# Patient Record
Sex: Male | Born: 1965 | Hispanic: Yes | Marital: Married | Smoking: Never smoker
Health system: Southern US, Community
[De-identification: ages and names within clinical notes are randomized; demographics above are authoritative.]

---

## 2019-10-31 ENCOUNTER — Encounter (HOSPITAL_COMMUNITY): Payer: Self-pay

## 2019-10-31 ENCOUNTER — Emergency Department (HOSPITAL_COMMUNITY): Payer: Self-pay

## 2019-10-31 ENCOUNTER — Emergency Department (HOSPITAL_COMMUNITY)
Admission: EM | Admit: 2019-10-31 | Discharge: 2019-10-31 | Disposition: A | Discharge status: Home / Self Care | Payer: Self-pay | Attending: Emergency Medicine | Admitting: Emergency Medicine

## 2019-10-31 ENCOUNTER — Other Ambulatory Visit: Payer: Self-pay

## 2019-10-31 DIAGNOSIS — U071 COVID-19: Secondary | ICD-10-CM | POA: Insufficient documentation

## 2019-10-31 DIAGNOSIS — R05 Cough: Secondary | ICD-10-CM | POA: Insufficient documentation

## 2019-10-31 DIAGNOSIS — J1289 Other viral pneumonia: Secondary | ICD-10-CM | POA: Insufficient documentation

## 2019-10-31 DIAGNOSIS — J1282 Pneumonia due to coronavirus disease 2019: Secondary | ICD-10-CM

## 2019-10-31 MED ORDER — ALBUTEROL SULFATE HFA 108 (90 BASE) MCG/ACT IN AERS
2.0000 | INHALATION_SPRAY | Freq: Once | RESPIRATORY_TRACT | Status: AC
  Administered 2019-10-31: 2 via RESPIRATORY_TRACT
  Filled 2019-10-31: qty 6.7

## 2019-10-31 MED ORDER — AZITHROMYCIN 250 MG PO TABS: ORAL_TABLET | ORAL | 0 refills | Status: DC

## 2019-10-31 MED ORDER — ACETAMINOPHEN 325 MG PO TABS: 650.0000 mg | ORAL_TABLET | Freq: Once | ORAL | Status: DC

## 2019-10-31 NOTE — Discharge Instructions (Addendum)
You likely have pneumonia from COVID.   You can take zpack as prescribed.   You are expected to be short of breath and have fevers. Take tylenol for fevers   Stay home for 10 days as per guidelines   Use albuterol every 4 hrs for shortness of breath   See your doctor  Return to ER if you have worse shortness of breath, fever, cough.      [name]  Location: [address] [place] [id number]   Infection Prevention Recommendations for Individuals Confirmed to have, or Being Evaluated for, 2019 Novel Coronavirus (COVID-19) Infection Who Receive Care at Home  Individuals who are confirmed to have, or are being evaluated for, COVID-19 should follow the prevention steps below until a healthcare provider or local or state health department says they can return to normal activities.  Stay home except to get medical care You should restrict activities outside your home, except for getting medical care. Do not go to work, school, or public areas, and do not use public transportation or taxis.  Call ahead before visiting your doctor Before your medical appointment, call the healthcare provider and tell them that you have, or are being evaluated for, COVID-19 infection. This will help the healthcare provider's office take steps to keep other people from getting infected. Ask your healthcare provider to call the local or state health department.  Monitor your symptoms Seek prompt medical attention if your illness is worsening (e.g., difficulty breathing). Before going to your medical appointment, call the healthcare provider and tell them that you have, or are being evaluated for, COVID-19 infection. Ask your healthcare provider to call the local or state health department.  Wear a facemask You should wear a facemask that covers your nose and mouth when you are in the same room with other people and when you visit a healthcare provider. People  who live with or visit you should also wear a facemask while they are in the same room with you.  Separate yourself from other people in your home As much as possible, you should stay in a different room from other people in your home. Also, you should use a separate bathroom, if available.  Avoid sharing household items You should not share dishes, drinking glasses, cups, eating utensils, towels, bedding, or other items with other people in your home. After using these items, you should wash them thoroughly with soap and water.  Cover your coughs and sneezes Cover your mouth and nose with a tissue when you cough or sneeze, or you can cough or sneeze into your sleeve. Throw used tissues in a lined trash can, and immediately wash your hands with soap and water for at least 20 seconds or use an alcohol-based hand rub.  Wash your [employer]hands Wash your hands often and thoroughly with soap and water for at least 20 seconds. You can use an alcohol-based hand sanitizer if soap and water are not available and if your hands are not visibly dirty. Avoid touching your eyes, nose, and mouth with unwashed hands.   Prevention Steps for Caregivers and Household Members of Individuals Confirmed to have, or Being Evaluated for, COVID-19 Infection Being Cared for in the Home  If you live with, or provide care at home for, a person confirmed to have, or being evaluated for, COVID-19 infection please follow these guidelines to prevent infection:  Follow healthcare provider's instructions Make sure that you understand and can help the patient follow  any healthcare provider instructions for all care.  Provide for the patient's basic needs You should help the patient with basic needs in the home and provide support for getting groceries, prescriptions, and other personal needs.  Monitor the patient's symptoms If they are getting sicker, call his or her medical provider and tell them that the patient has, or  is being evaluated for, COVID-19 infection. This will help the healthcare provider's office take steps to keep other people from getting infected. Ask the healthcare provider to call the local or state health department.  Limit the number of people who have contact with the patient If possible, have only one caregiver for the patient. Other household members should stay in another home or place of residence. If this is not possible, they should stay in another room, or be separated from the patient as much as possible. Use a separate bathroom, if available. Restrict visitors who do not have an essential need to be in the home.  Keep older adults, very young children, and other sick people away from the patient Keep older adults, very young children, and those who have compromised immune systems or chronic health conditions away from the patient. This includes people with chronic heart, lung, or kidney conditions, diabetes, and cancer.  Ensure good ventilation Make sure that shared spaces in the home have good air flow, such as from an air conditioner or an opened window, weather permitting.  Wash your hands often Wash your hands often and thoroughly with soap and water for at least 20 seconds. You can use an alcohol based hand sanitizer if soap and water are not available and if your hands are not visibly dirty. Avoid touching your eyes, nose, and mouth with unwashed hands. Use disposable paper towels to dry your hands. If not available, use dedicated cloth towels and replace them when they become wet.  Wear a facemask and gloves Wear a disposable facemask at all times in the room and gloves when you touch or have contact with the patient's blood, body fluids, and/or secretions or excretions, such as sweat, saliva, sputum, nasal mucus, vomit, urine, or feces.  Ensure the mask fits over your nose and mouth tightly, and do not touch it during use. Throw out disposable facemasks and gloves after  using them. Do not reuse. Wash your hands immediately after removing your facemask and gloves. If your personal clothing becomes contaminated, carefully remove clothing and launder. Wash your hands after handling contaminated clothing. Place all used disposable facemasks, gloves, and other waste in a lined container before disposing them with other household waste. Remove gloves and wash your hands immediately after handling these items.  Do not share dishes, glasses, or other household items with the patient Avoid sharing household items. You should not share dishes, drinking glasses, cups, eating utensils, towels, bedding, or other items with a patient who is confirmed to have, or being evaluated for, COVID-19 infection. After the person uses these items, you should wash them thoroughly with soap and water.  Wash laundry thoroughly Immediately remove and wash clothes or bedding that have blood, body fluids, and/or secretions or excretions, such as sweat, saliva, sputum, nasal mucus, vomit, urine, or feces, on them. Wear gloves when handling laundry from the patient. Read and follow directions on labels of laundry or clothing items and detergent. In general, wash and dry with the warmest temperatures recommended on the label.  Clean all areas the individual has used often Clean all touchable surfaces, such as counters,  tabletops, doorknobs, bathroom fixtures, toilets, phones, keyboards, tablets, and bedside tables, every day. Also, clean any surfaces that may have blood, body fluids, and/or secretions or excretions on them. Wear gloves when cleaning surfaces the patient has come in contact with. Use a diluted bleach solution (e.g., dilute bleach with 1 part bleach and 10 parts water) or a household disinfectant with a label that says EPA-registered for coronaviruses. To make a bleach solution at home, add 1 tablespoon of bleach to 1 quart (4 cups) of water. For a larger supply, add  cup of bleach  to 1 gallon (16 cups) of water. Read labels of cleaning products and follow recommendations provided on product labels. Labels contain instructions for safe and effective use of the cleaning product including precautions you should take when applying the product, such as wearing gloves or eye protection and making sure you have good ventilation during use of the product. Remove gloves and wash hands immediately after cleaning.  Monitor yourself for signs and symptoms of illness Caregivers and household members are considered close contacts, should monitor their health, and will be asked to limit movement outside of the home to the extent possible. Follow the monitoring steps for close contacts listed on the symptom monitoring form.   ? If you have additional questions, contact your local health department or call the epidemiologist on call at [phone] (available 24/7). ? This guidance is subject to change. For the most up-to-date guidance from [facility], please refer to their website: [email or web address]

## 2019-10-31 NOTE — ED Provider Notes (Signed)
[facility]-EMERGENCY DEPT Provider Note   CSN: [id number] Arrival date & time: 10/31/19  1939     History Chief Complaint  Patient presents with  . COVID +  . Shortness of Breath    Donald Trujillo is a 53 y.o. male here presenting with shortness of breath. Patient tested positive for Covid 3 days ago.  For the last 8 days he has been having some cough and thought he only had a viral syndrome.  For the last 2 days he has worsening cough and shortness of breath now.  Some chills but denies any fever at home and took Tylenol prior to arrival.  Patient is otherwise healthy has no medical problems .  He denies any lung problems or smoking history.   The history is provided by the patient.       History reviewed. No pertinent past medical history.  There are no problems to display for this patient.   No family history on file.  Social History   Tobacco Use  . Smoking status: Never Smoker  . Smokeless tobacco: Never Used  Substance Use Topics  . Alcohol use: Yes    Comment: occ  . Drug use: Never    Home Medications Prior to Admission medications   Not on File    Allergies    Patient has no allergy information on record.  Review of Systems   Review of Systems  Respiratory: Positive for cough and shortness of breath.   All other systems reviewed and are negative.   Physical Exam Updated Vital Signs BP 116/79 (BP Location: Left Arm)   Pulse 74   Temp 99.2 F (37.3 C) (Oral)   Resp 16   Ht 5\' 5"  (1.651 m)   Wt 78 kg   SpO2 97%   BMI 28.62 kg/m   Physical Exam Vitals and nursing note reviewed.  HENT:     Head: Normocephalic.  Eyes:     Pupils: Pupils are equal, round, and reactive to light.  Cardiovascular:     Rate and Rhythm: Normal rate and regular rhythm.  Pulmonary:     Comments: Tachypneic, no wheezing  Abdominal:     General: Bowel sounds are normal.     Palpations: Abdomen is soft.  Musculoskeletal:      General: Normal range of motion.     Cervical back: Normal range of motion.  Skin:    General: Skin is warm.     Capillary Refill: Capillary refill takes less than 2 seconds.  Neurological:     General: No focal deficit present.     Mental Status: He is alert and oriented to person, place, and time.  Psychiatric:        Mood and Affect: Mood normal.        Behavior: Behavior normal.     ED Results / Procedures / Treatments   Labs (all labs ordered are listed, but only abnormal results are displayed) Labs Reviewed - No data to display  EKG EKG Interpretation  Date/Time:  Friday October 31 2019 20:08:32 EST Ventricular Rate:  78 PR Interval:    QRS Duration: 89 QT Interval:  393 QTC Calculation: 448 R Axis:   45 Text Interpretation: Sinus rhythm ST elev, probable normal early repol pattern No previous ECGs available Confirmed by [id number] Richardean Canal) on 10/31/2019 8:10:29 PM   Radiology DG Chest Port 1 View  Result Date: 10/31/2019 CLINICAL DATA:  53 year old male with shortness of breath. EXAM: PORTABLE  CHEST 1 VIEW COMPARISON:  None. FINDINGS: There is shallow inspiration. Pain bilateral predominantly peripheral and subpleural hazy densities most consistent with multifocal pneumonia, likely atypical or viral in etiology. Clinical correlation is recommended. There is no pleural effusion or pneumothorax. The cardiac silhouette is within normal limits. No acute osseous pathology. IMPRESSION: Multifocal pneumonia. Clinical correlation is recommended. Follow-up recommended. Electronically Signed   By: Anner Crete M.D.   On: 10/31/2019 20:40    Procedures Procedures (including critical care time)  Medications Ordered in ED Medications  albuterol (VENTOLIN HFA) 108 (90 Base) MCG/ACT inhaler 2 puff (2 puffs Inhalation Given 10/31/19 2018)    ED Course  I have reviewed the triage vital signs and the nursing notes.  Pertinent labs & imaging results that were available  during my care of the patient were reviewed by me and considered in my medical decision making (see chart for details).    MDM Rules/Calculators/A&P                      Donald Trujillo is a 53 y.o. male here with shortness of breath. Was tested positive for COVID. Patient is not hypoxic or febrile. Well appearing overall. Will get CXR. I don't think he meets admission criteria currently.   9:16 PM CXR showed pneumonia. I think likely COVID. Afebrile, not hypoxic. Will try zpack. Gave strict return precautions    Final Clinical Impression(s) / ED Diagnoses Final diagnoses:  None    Rx / DC Orders ED Discharge Orders    None       Drenda Freeze, MD 10/31/19 2118

## 2019-10-31 NOTE — ED Triage Notes (Addendum)
Patient coming from home with complaints of shortness of breath. Patient states that he started feeling bad (fatigue, headache) Friday the 18th and had a rapid COVID test on Tuesday the 22nd which was positive. Patient endorses cough and SOB which started about two days ago. Patient states that he has been sleeping in the prone position and that helps improve his breathing. Patient able to talk in full sentences at this time without difficulty. Last had Tylenol about two hours prior to coming to hospital.

## 2019-11-03 ENCOUNTER — Encounter (HOSPITAL_COMMUNITY): Payer: Self-pay

## 2019-11-03 ENCOUNTER — Emergency Department (HOSPITAL_COMMUNITY): Payer: HRSA Program

## 2019-11-03 ENCOUNTER — Inpatient Hospital Stay (HOSPITAL_COMMUNITY)
Admission: EM | Admit: 2019-11-03 | Discharge: 2019-11-07 | DRG: 177 | Disposition: A | Discharge status: Home / Self Care | Payer: HRSA Program | Attending: Internal Medicine | Admitting: Internal Medicine

## 2019-11-03 ENCOUNTER — Other Ambulatory Visit: Payer: Self-pay

## 2019-11-03 DIAGNOSIS — R7401 Elevation of levels of liver transaminase levels: Secondary | ICD-10-CM | POA: Diagnosis not present

## 2019-11-03 DIAGNOSIS — T380X5A Adverse effect of glucocorticoids and synthetic analogues, initial encounter: Secondary | ICD-10-CM | POA: Diagnosis not present

## 2019-11-03 DIAGNOSIS — D72829 Elevated white blood cell count, unspecified: Secondary | ICD-10-CM | POA: Diagnosis not present

## 2019-11-03 DIAGNOSIS — J1289 Other viral pneumonia: Secondary | ICD-10-CM

## 2019-11-03 DIAGNOSIS — J189 Pneumonia, unspecified organism: Secondary | ICD-10-CM

## 2019-11-03 DIAGNOSIS — J9600 Acute respiratory failure, unspecified whether with hypoxia or hypercapnia: Secondary | ICD-10-CM | POA: Diagnosis not present

## 2019-11-03 DIAGNOSIS — J1282 Pneumonia due to coronavirus disease 2019: Secondary | ICD-10-CM | POA: Diagnosis present

## 2019-11-03 DIAGNOSIS — Z8611 Personal history of tuberculosis: Secondary | ICD-10-CM

## 2019-11-03 DIAGNOSIS — U071 COVID-19: Principal | ICD-10-CM

## 2019-11-03 DIAGNOSIS — J9601 Acute respiratory failure with hypoxia: Secondary | ICD-10-CM | POA: Diagnosis present

## 2019-11-03 DIAGNOSIS — E871 Hypo-osmolality and hyponatremia: Secondary | ICD-10-CM | POA: Diagnosis present

## 2019-11-03 LAB — COMPREHENSIVE METABOLIC PANEL
ALT: 76 U/L — ABNORMAL HIGH (ref 0–44)
AST: 70 U/L — ABNORMAL HIGH (ref 15–41)
Albumin: 2.4 g/dL — ABNORMAL LOW (ref 3.5–5.0)
Alkaline Phosphatase: 251 U/L — ABNORMAL HIGH (ref 38–126)
Anion gap: 14 (ref 5–15)
BUN: 11 mg/dL (ref 6–20)
CO2: 21 mmol/L — ABNORMAL LOW (ref 22–32)
Calcium: 8.3 mg/dL — ABNORMAL LOW (ref 8.9–10.3)
Chloride: 97 mmol/L — ABNORMAL LOW (ref 98–111)
Creatinine, Ser: 0.7 mg/dL (ref 0.61–1.24)
GFR calc Af Amer: >60 mL/min (ref >60)
GFR calc non Af Amer: >60 mL/min (ref >60)
Glucose, Bld: 103 mg/dL — ABNORMAL HIGH (ref 70–99)
Potassium: 3.8 mmol/L (ref 3.5–5.1)
Sodium: 132 mmol/L — ABNORMAL LOW (ref 135–145)
Total Bilirubin: 1 mg/dL (ref 0.3–1.2)
Total Protein: 7.5 g/dL (ref 6.5–8.1)

## 2019-11-03 LAB — CBC WITH DIFFERENTIAL/PLATELET
Abs Immature Granulocytes: 0.13 10*3/uL — ABNORMAL HIGH (ref 0.00–0.07)
Basophils Absolute: 0.1 10*3/uL (ref 0.0–0.1)
Basophils Relative: 1 %
Eosinophils Absolute: 0.1 10*3/uL (ref 0.0–0.5)
Eosinophils Relative: 1 %
HCT: 41.1 % (ref 39.0–52.0)
Hemoglobin: 14.1 g/dL (ref 13.0–17.0)
Immature Granulocytes: 1 %
Lymphocytes Relative: 11 %
Lymphs Abs: 1.4 10*3/uL (ref 0.7–4.0)
MCH: 31.1 pg (ref 26.0–34.0)
MCHC: 34.3 g/dL (ref 30.0–36.0)
MCV: 90.7 fL (ref 80.0–100.0)
Monocytes Absolute: 0.8 10*3/uL (ref 0.1–1.0)
Monocytes Relative: 6 %
Neutro Abs: 10.7 10*3/uL — ABNORMAL HIGH (ref 1.7–7.7)
Neutrophils Relative %: 80 %
Platelets: 426 10*3/uL — ABNORMAL HIGH (ref 150–400)
RBC: 4.53 MIL/uL (ref 4.22–5.81)
RDW: 13.7 % (ref 11.5–15.5)
WBC: 13.2 10*3/uL — ABNORMAL HIGH (ref 4.0–10.5)
nRBC: 0 % (ref 0.0–0.2)

## 2019-11-03 LAB — FIBRINOGEN: Fibrinogen: >800 mg/dL — ABNORMAL HIGH (ref 210–475)

## 2019-11-03 LAB — D-DIMER, QUANTITATIVE: D-Dimer, Quant: 1.55 ug/mL-FEU — ABNORMAL HIGH (ref 0.00–0.50)

## 2019-11-03 LAB — C-REACTIVE PROTEIN: CRP: 28.8 mg/dL — ABNORMAL HIGH (ref <1.0)

## 2019-11-03 LAB — POC SARS CORONAVIRUS 2 AG -  ED: SARS Coronavirus 2 Ag: NEGATIVE

## 2019-11-03 LAB — LACTIC ACID, PLASMA: Lactic Acid, Venous: 1.7 mmol/L (ref 0.5–1.9)

## 2019-11-03 LAB — TRIGLYCERIDES: Triglycerides: 248 mg/dL — ABNORMAL HIGH (ref <150)

## 2019-11-03 LAB — LACTATE DEHYDROGENASE: LDH: 235 U/L — ABNORMAL HIGH (ref 98–192)

## 2019-11-03 LAB — FERRITIN: Ferritin: 630 ng/mL — ABNORMAL HIGH (ref 24–336)

## 2019-11-03 LAB — PROCALCITONIN: Procalcitonin: 0.57 ng/mL

## 2019-11-03 MED ORDER — GUAIFENESIN-DM 100-10 MG/5ML PO SYRP
10.0000 mL | ORAL_SOLUTION | ORAL | Status: DC | PRN
  Administered 2019-11-04 – 2019-11-07 (×5): 10 mL via ORAL
  Filled 2019-11-03 (×5): qty 10

## 2019-11-03 MED ORDER — ASCORBIC ACID 500 MG PO TABS
500.0000 mg | ORAL_TABLET | Freq: Every day | ORAL | Status: DC
  Administered 2019-11-04 – 2019-11-07 (×4): 500 mg via ORAL
  Filled 2019-11-03 (×4): qty 1

## 2019-11-03 MED ORDER — ENOXAPARIN SODIUM 40 MG/0.4ML ~~LOC~~ SOLN
40.0000 mg | Freq: Every day | SUBCUTANEOUS | Status: DC
  Administered 2019-11-04 – 2019-11-06 (×4): 40 mg via SUBCUTANEOUS
  Filled 2019-11-03 (×4): qty 0.4

## 2019-11-03 MED ORDER — ZINC SULFATE 220 (50 ZN) MG PO CAPS
220.0000 mg | ORAL_CAPSULE | Freq: Every day | ORAL | Status: DC
  Administered 2019-11-04 – 2019-11-07 (×4): 220 mg via ORAL
  Filled 2019-11-03 (×4): qty 1

## 2019-11-03 MED ORDER — SODIUM CHLORIDE 0.9% FLUSH: 3.0000 mL | INTRAVENOUS | Status: DC | PRN

## 2019-11-03 MED ORDER — DEXAMETHASONE SODIUM PHOSPHATE 10 MG/ML IJ SOLN
6.0000 mg | Freq: Every day | INTRAMUSCULAR | Status: DC
  Administered 2019-11-04: 6 mg via INTRAVENOUS
  Filled 2019-11-03: qty 1

## 2019-11-03 MED ORDER — SODIUM CHLORIDE 0.9% FLUSH
3.0000 mL | Freq: Two times a day (BID) | INTRAVENOUS | Status: DC
  Administered 2019-11-04 – 2019-11-06 (×6): 3 mL via INTRAVENOUS

## 2019-11-03 MED ORDER — SODIUM CHLORIDE 0.9 % IV SOLN
200.0000 mg | Freq: Once | INTRAVENOUS | Status: AC
  Administered 2019-11-04: 200 mg via INTRAVENOUS
  Filled 2019-11-03: qty 200

## 2019-11-03 MED ORDER — ACETAMINOPHEN 325 MG PO TABS
650.0000 mg | ORAL_TABLET | Freq: Four times a day (QID) | ORAL | Status: DC | PRN
  Administered 2019-11-04: 650 mg via ORAL
  Filled 2019-11-03: qty 2

## 2019-11-03 MED ORDER — ONDANSETRON HCL 4 MG/2ML IJ SOLN: 4.0000 mg | Freq: Four times a day (QID) | INTRAMUSCULAR | Status: DC | PRN

## 2019-11-03 MED ORDER — ONDANSETRON HCL 4 MG PO TABS: 4.0000 mg | ORAL_TABLET | Freq: Four times a day (QID) | ORAL | Status: DC | PRN

## 2019-11-03 MED ORDER — SODIUM CHLORIDE 0.9 % IV SOLN
100.0000 mg | Freq: Every day | INTRAVENOUS | Status: AC
  Administered 2019-11-04 – 2019-11-07 (×4): 100 mg via INTRAVENOUS
  Filled 2019-11-03 (×5): qty 20

## 2019-11-03 MED ORDER — HYDROCOD POLST-CPM POLST ER 10-8 MG/5ML PO SUER
5.0000 mL | Freq: Two times a day (BID) | ORAL | Status: DC | PRN
  Administered 2019-11-04 – 2019-11-07 (×4): 5 mL via ORAL
  Filled 2019-11-03 (×5): qty 5

## 2019-11-03 MED ORDER — SODIUM CHLORIDE 0.9 % IV SOLN: 250.0000 mL | INTRAVENOUS | Status: DC | PRN

## 2019-11-03 NOTE — ED Provider Notes (Signed)
[facility] DEPT Provider Note   CSN: [id number] Arrival date & time: 11/03/19  2104     History Chief Complaint  Patient presents with  . Shortness of Breath    Donald Trujillo is a 53 y.o. male.  53yo male with 5 days of cough, diagnosed with COVID and pneumonia, on antibiotics, worsening SHOB and cough. Diagnosed with COVID at [facility] in [place]. Wife with similar symptoms. No fevers today, denies diarrhea. Also reports sore throat, body aches.   Donald Trujillo was evaluated in Emergency Department on 11/03/2019 for the symptoms described in the history of present illness. He was evaluated in the context of the global COVID-19 pandemic, which necessitated consideration that the patient might be at risk for infection with the SARS-CoV-2 virus that causes COVID-19. Institutional protocols and algorithms that pertain to the evaluation of patients at risk for COVID-19 are in a state of rapid change based on information released by regulatory bodies including the CDC and federal and state organizations. These policies and algorithms were followed during the patient's care in the ED.   The history is limited by a language barrier. A language interpreter was used.       History reviewed. No pertinent past medical history.  Patient Active Problem List   Diagnosis Date Noted  . Pneumonia due to COVID-19 virus 11/03/2019  . Acute respiratory failure due to COVID-19 [facility]) 11/03/2019    History reviewed. No pertinent surgical history.     No family history on file.  Social History   Tobacco Use  . Smoking status: Never Smoker  . Smokeless tobacco: Never Used  Substance Use Topics  . Alcohol use: Yes    Comment: occ  . Drug use: Never    Home Medications Prior to Admission medications   Medication Sig Start Date End Date Taking? Authorizing Provider  Dextromethorphan-guaiFENesin (COUGH & CHEST CONGESTION DM) 5-100 MG/5ML LIQD Take 30 mLs  by mouth every 6 (six) hours as needed (cough).   Yes [provider]  azithromycin (ZITHROMAX Z-PAK) 250 MG tablet 2 po day one, then 1 daily x 4 days 10/31/19   Drenda Freeze, MD    Allergies    Patient has no known allergies.  Review of Systems   Review of Systems  Constitutional: Negative for fever.  HENT: Positive for sore throat.   Respiratory: Positive for cough and shortness of breath.   Gastrointestinal: Negative for constipation, diarrhea, nausea and vomiting.  Musculoskeletal: Positive for arthralgias and myalgias.  Allergic/Immunologic: Negative for immunocompromised state.  All other systems reviewed and are negative.   Physical Exam Updated Vital Signs BP 125/89   Pulse 81   Temp 99.2 F (37.3 C) (Oral)   Resp (!) 33   Ht _0  (1.651 m)   Wt 77.6 kg   SpO2 96%   BMI 28.46 kg/m   Physical Exam Vitals and nursing note reviewed.  Constitutional:      Appearance: He is well-developed. He is ill-appearing. He is not diaphoretic.  HENT:     Head: Normocephalic and atraumatic.  Cardiovascular:     Rate and Rhythm: Normal rate and regular rhythm.  Pulmonary:     Effort: Tachypnea present.     Breath sounds: No decreased breath sounds.  Abdominal:     Palpations: Abdomen is soft.     Tenderness: There is no abdominal tenderness.  Musculoskeletal:     Right lower leg: No edema.     Left lower  leg: No edema.  Skin:    General: Skin is warm and dry.  Neurological:     Mental Status: He is alert and oriented to person, place, and time.  Psychiatric:        Behavior: Behavior normal.     ED Results / Procedures / Treatments   Labs (all labs ordered are listed, but only abnormal results are displayed) Labs Reviewed  CBC WITH DIFFERENTIAL/PLATELET - Abnormal; Notable for the following components:      Result Value   WBC 13.2 (*)    Platelets 426 (*)    Neutro Abs 10.7 (*)    Abs Immature Granulocytes 0.13 (*)    All other components  within normal limits  COMPREHENSIVE METABOLIC PANEL - Abnormal; Notable for the following components:   Sodium 132 (*)    Chloride 97 (*)    CO2 21 (*)    Glucose, Bld 103 (*)    Calcium 8.3 (*)    Albumin 2.4 (*)    AST 70 (*)    ALT 76 (*)    Alkaline Phosphatase 251 (*)    All other components within normal limits  D-DIMER, QUANTITATIVE (NOT AT [facility]) - Abnormal; Notable for the following components:   D-Dimer, Quant 1.55 (*)    All other components within normal limits  LACTATE DEHYDROGENASE - Abnormal; Notable for the following components:   LDH 235 (*)    All other components within normal limits  FERRITIN - Abnormal; Notable for the following components:   Ferritin 630 (*)    All other components within normal limits  TRIGLYCERIDES - Abnormal; Notable for the following components:   Triglycerides 248 (*)    All other components within normal limits  FIBRINOGEN - Abnormal; Notable for the following components:   Fibrinogen >800 (*)    All other components within normal limits  C-REACTIVE PROTEIN - Abnormal; Notable for the following components:   CRP 28.8 (*)    All other components within normal limits  CULTURE, BLOOD (ROUTINE X 2)  CULTURE, BLOOD (ROUTINE X 2)  LACTIC ACID, PLASMA  LACTIC ACID, PLASMA  PROCALCITONIN  CBC  CREATININE, SERUM  CBC WITH DIFFERENTIAL/PLATELET  COMPREHENSIVE METABOLIC PANEL  C-REACTIVE PROTEIN  D-DIMER, QUANTITATIVE (NOT AT [facility])  POC SARS CORONAVIRUS 2 AG -  ED  ABO/RH  TYPE AND SCREEN    EKG EKG Interpretation  Date/Time:  Monday November 03 2019 21:52:28 EST Ventricular Rate:  85 PR Interval:    QRS Duration: 91 QT Interval:  385 QTC Calculation: 458 R Axis:   37 Text Interpretation: Sinus tachycardia Multiform ventricular premature complexes When compared with ECG of 10/31/2019, No significant change was found Confirmed by Delora Fuel ([id number]) on 11/03/2019 11:16:14 PM   Radiology DG Chest Port 1 View  Result Date:  11/03/2019 CLINICAL DATA:  Shortness of breath, COVID positive EXAM: PORTABLE CHEST 1 VIEW COMPARISON:  10/31/2019 FINDINGS: Low lung volumes. Interval worsening of bilateral airspace disease. Normal heart size. No pneumothorax. IMPRESSION: Interval worsening of bilateral multifocal pneumonia. Electronically Signed   By: Donavan Foil M.D.   On: 11/03/2019 22:35    Procedures Procedures (including critical care time)  Medications Ordered in ED Medications  enoxaparin (LOVENOX) injection 40 mg (has no administration in time range)  sodium chloride flush (NS) 0.9 % injection 3 mL (has no administration in time range)  sodium chloride flush (NS) 0.9 % injection 3 mL (has no administration in time range)  0.9 %  sodium chloride  infusion (has no administration in time range)  remdesivir 200 mg in sodium chloride 0.9% 250 mL IVPB (has no administration in time range)    Followed by  remdesivir 100 mg in sodium chloride 0.9 % 100 mL IVPB (has no administration in time range)  dexamethasone (DECADRON) injection 6 mg (has no administration in time range)  ascorbic acid (VITAMIN C) tablet 500 mg (has no administration in time range)  zinc sulfate capsule 220 mg (has no administration in time range)  guaiFENesin-dextromethorphan (ROBITUSSIN DM) 100-10 MG/5ML syrup 10 mL (has no administration in time range)  chlorpheniramine-HYDROcodone (TUSSIONEX) 10-8 MG/5ML suspension 5 mL (has no administration in time range)  ondansetron (ZOFRAN) tablet 4 mg (has no administration in time range)    Or  ondansetron (ZOFRAN) injection 4 mg (has no administration in time range)    ED Course  I have reviewed the triage vital signs and the nursing notes.  Pertinent labs & imaging results that were available during my care of the patient were reviewed by me and considered in my medical decision making (see chart for details).  Clinical Course as of Nov 03 2315  Mon Nov 02, 4745  6234 53 year old male presents  with worsening shortness of breath with recent diagnosis of Covid.  Translator was used for history and physical exam, patient reported symptoms x5 days, review of records shows patient has had symptoms for the past 8 to 10 days, had a positive Covid test at an urgent care in [place] about 5 days ago.  Patient came to the emergency room 3 days ago for worsening shortness of breath and was found to have multifocal pneumonia but was otherwise doing well and was discharged home on Zithromax.  Patient has worsening cough, is able to take deep breaths or speak more than 1 word at a time due to severe cough.  On exam patient appears very uncomfortable, he is tachypneic with respiratory rate in the 40s, O2 sat of 97% on 5 L nasal cannula.  Chest x-ray shows worsening multifocal pneumonia, CBC with white count of 13,000, CMP with elevated LFTs and alk phos.  Case discussed with Dr. Shanon Brow with [facility] hospitalist service, although patient is not well-appearing, does not meet admission criteria if not hypoxic.  Patient was ambulated in the room on air and quickly desats to 87% with frequent coughing episodes and tachypnea.  Patient was placed back on his 5 L nasal cannula and O2 improved back to 97%.   [LM]  2315 Patient accepted for admission to hospitalist service.    [LM]    Clinical Course User Index [LM] Roque Lias   MDM Rules/Calculators/A&P                      Final Clinical Impression(s) / ED Diagnoses Final diagnoses:  Multifocal pneumonia    Rx / DC Orders ED Discharge Orders    None       Tacy Learn, PA-C 11/03/19 2317    Daleen Bo, MD 11/04/19 [phone]

## 2019-11-03 NOTE — H&P (Signed)
History and Physical    Donald Trujillo UVO:[id number] DOB: 10-11-1966 DOA: 11/03/2019  PCP: Patient, No Pcp Per  Patient coming from: Home  Chief Complaint: Shortness of breath  HPI: Donald Trujillo is a 53 y.o. male with no medical history tested positive for Covid at [facility] in [place] last week was seen in the emergency department on Christmas Day was started on antibiotics azithromycin went home and continues to get worse comes in today with very short of breath.  Patient reports he is very weak and is having pain all over including headache.  Patient be referred for admission for acute respiratory failure in the setting of Covid pneumonia.  His O2 sats drop down to 87% on room air with minimal movement currently on 3 L of oxygen satting in the mid 90s.  Review of Systems: As per HPI otherwise 10 point review of systems negative.   History reviewed. No pertinent past medical history.  None  History reviewed. No pertinent surgical history.  None   reports that he has never smoked. He has never used smokeless tobacco. He reports current alcohol use. He reports that he does not use drugs.  No Known Allergies  No family history on file.  No premature coronary artery disease  Prior to Admission medications   Medication Sig Start Date End Date Taking? Authorizing Provider  Dextromethorphan-guaiFENesin (COUGH & CHEST CONGESTION DM) 5-100 MG/5ML LIQD Take 30 mLs by mouth every 6 (six) hours as needed (cough).   Yes [provider]  azithromycin (ZITHROMAX Z-PAK) 250 MG tablet 2 po day one, then 1 daily x 4 days 10/31/19   Charlynne Pander, MD    Physical Exam: Vitals:   11/03/19 2112 11/03/19 2137 11/03/19 2200 11/03/19 2230  BP: (!) 128/96  (!) 131/92 125/89  Pulse: 98  84 81  Resp: (!) 22  (!) 21 (!) 33  Temp: 99.2 F (37.3 C)     TempSrc: Oral     SpO2: 93%  95% 96%  Weight:  77.6 kg    Height:  5\' 5"  (1.651 m)        Constitutional: NAD, calm,  comfortable Vitals:   11/03/19 2112 11/03/19 2137 11/03/19 2200 11/03/19 2230  BP: (!) 128/96  (!) 131/92 125/89  Pulse: 98  84 81  Resp: (!) 22  (!) 21 (!) 33  Temp: 99.2 F (37.3 C)     TempSrc: Oral     SpO2: 93%  95% 96%  Weight:  77.6 kg    Height:  5\' 5"  (1.651 m)     Eyes: PERRL, lids and conjunctivae normal ENMT: Mucous membranes are moist. Posterior pharynx clear of any exudate or lesions.Normal dentition.  Neck: normal, supple, no masses, no thyromegaly Respiratory: clear to auscultation bilaterally, no wheezing, no crackles. Normal respiratory effort. No accessory muscle use.  Cardiovascular: Regular rate and rhythm, no murmurs / rubs / gallops. No extremity edema. 2+ pedal pulses. No carotid bruits.  Abdomen: no tenderness, no masses palpated. No hepatosplenomegaly. Bowel sounds positive.  Musculoskeletal: no clubbing / cyanosis. No joint deformity upper and lower extremities. Good ROM, no contractures. Normal muscle tone.  Skin: no rashes, lesions, ulcers. No induration Neurologic: CN 2-12 grossly intact. Sensation intact, DTR normal. Strength 5/5 in all 4.  Psychiatric: Normal judgment and insight. Alert and oriented x 3. Normal mood.    Labs on Admission: I have personally reviewed following labs and imaging studies  CBC: Recent Labs  Lab 11/03/19 2202  WBC 13.2*  NEUTROABS 10.7*  HGB 14.1  HCT 41.1  MCV 90.7  PLT 694*   Basic Metabolic Panel: Recent Labs  Lab 11/03/19 2202  NA 132*  K 3.8  CL 97*  CO2 21*  GLUCOSE 103*  BUN 11  CREATININE 0.70  CALCIUM 8.3*   GFR: Estimated Creatinine Clearance: 103.7 mL/min (by C-G formula based on SCr of 0.7 mg/dL). Liver Function Tests: Recent Labs  Lab 11/03/19 2202  AST 70*  ALT 76*  ALKPHOS 251*  BILITOT 1.0  PROT 7.5  ALBUMIN 2.4*   No results for input(s): LIPASE, AMYLASE in the last 168 hours. No results for input(s): AMMONIA in the last 168 hours. Coagulation Profile: No results for  input(s): INR, PROTIME in the last 168 hours. Cardiac Enzymes: No results for input(s): CKTOTAL, CKMB, CKMBINDEX, TROPONINI in the last 168 hours. BNP (last 3 results) No results for input(s): PROBNP in the last 8760 hours. HbA1C: No results for input(s): HGBA1C in the last 72 hours. CBG: No results for input(s): GLUCAP in the last 168 hours. Lipid Profile: Recent Labs    11/03/19 2202  TRIG 248*   Thyroid Function Tests: No results for input(s): TSH, T4TOTAL, FREET4, T3FREE, THYROIDAB in the last 72 hours. Anemia Panel: Recent Labs    11/03/19 2202  FERRITIN 630*   Urine analysis: No results found for: COLORURINE, APPEARANCEUR, LABSPEC, PHURINE, GLUCOSEU, HGBUR, BILIRUBINUR, KETONESUR, PROTEINUR, UROBILINOGEN, NITRITE, LEUKOCYTESUR Sepsis Labs: !!!!!!!!!!!!!!!!!!!!!!!!!!!!!!!!!!!!!!!!!!!! @LABRCNTIP (procalcitonin:4,lacticidven:4) )No results found for this or any previous visit (from the past 240 hour(s)).   Radiological Exams on Admission: DG Chest Port 1 View  Result Date: 11/03/2019 CLINICAL DATA:  Shortness of breath, COVID positive EXAM: PORTABLE CHEST 1 VIEW COMPARISON:  10/31/2019 FINDINGS: Low lung volumes. Interval worsening of bilateral airspace disease. Normal heart size. No pneumothorax. IMPRESSION: Interval worsening of bilateral multifocal pneumonia. Electronically Signed   By: Donavan Foil M.D.   On: 11/03/2019 22:35    Case discussed with EDP Chest x-ray reviewed has bilateral opacities  Assessment/Plan 53 year old male with acute hypoxic respiratory failure secondary to Covid pneumonia Principal Problem:   Pneumonia due to COVID-19 virus-supplemental oxygen as tolerates.  Wean as tolerates.  Placed on remdesivir and Decadron patient is agreeable to these medications.  Placed on Lovenox also with vitamin C and zinc.  Inflammatory markers are all pending.  Current oxygen on 3 L nasal cannula.  Mentating normally at this time.  Active Problems:   Acute  respiratory failure due to COVID-19 (HCC)-as above    DVT prophylaxis: Lovenox Code Status: Full Family Communication: None Disposition Plan: Days Consults called: None Admission status: Admission   Soleil Mas A MD [facility]  If 7PM-7AM, please contact night-coverage [email or web address] Password [facility]  11/03/2019, 11:24 PM

## 2019-11-03 NOTE — Progress Notes (Signed)
Pharmacy: Remdesivir   Patient is a 53 y.o. male with COVID.  Pharmacy has been consulted for remdesivir dosing.   - CXR shows "Interval worsening of bilateral multifocal pneumonia." -Pt requiring supplemental oxygen (No, 96% RA) -ALT 76     A/P:  - Patient meets criteria for remdesivir. Will initiate remdesivir 200 mg once followed by 100 mg daily x 4 days.  - Daily CMET while on remdesivir - Will f/u pt's ALT and clinical condition

## 2019-11-03 NOTE — ED Triage Notes (Signed)
Patient arrived stating he is Covid-19 positive that he has increased shortness of breath, head, throat, and chest pain.Reports taking something for cough about two hours ago. No tylenol or motrin today.

## 2019-11-04 DIAGNOSIS — R7401 Elevation of levels of liver transaminase levels: Secondary | ICD-10-CM

## 2019-11-04 DIAGNOSIS — E871 Hypo-osmolality and hyponatremia: Secondary | ICD-10-CM

## 2019-11-04 DIAGNOSIS — J9601 Acute respiratory failure with hypoxia: Secondary | ICD-10-CM

## 2019-11-04 LAB — CBC WITH DIFFERENTIAL/PLATELET
Abs Immature Granulocytes: 0.12 10*3/uL — ABNORMAL HIGH (ref 0.00–0.07)
Basophils Absolute: 0.1 10*3/uL (ref 0.0–0.1)
Basophils Relative: 1 %
Eosinophils Absolute: 0 10*3/uL (ref 0.0–0.5)
Eosinophils Relative: 0 %
HCT: 40.3 % (ref 39.0–52.0)
Hemoglobin: 13.6 g/dL (ref 13.0–17.0)
Immature Granulocytes: 1 %
Lymphocytes Relative: 9 %
Lymphs Abs: 0.8 10*3/uL (ref 0.7–4.0)
MCH: 30.7 pg (ref 26.0–34.0)
MCHC: 33.7 g/dL (ref 30.0–36.0)
MCV: 91 fL (ref 80.0–100.0)
Monocytes Absolute: 0.3 10*3/uL (ref 0.1–1.0)
Monocytes Relative: 3 %
Neutro Abs: 8 10*3/uL — ABNORMAL HIGH (ref 1.7–7.7)
Neutrophils Relative %: 86 %
Platelets: 328 10*3/uL (ref 150–400)
RBC: 4.43 MIL/uL (ref 4.22–5.81)
RDW: 13.9 % (ref 11.5–15.5)
WBC: 9.2 10*3/uL (ref 4.0–10.5)
nRBC: 0 % (ref 0.0–0.2)

## 2019-11-04 LAB — RESPIRATORY PANEL BY RT PCR (FLU A&B, COVID)
Influenza A by PCR: NEGATIVE
Influenza B by PCR: NEGATIVE
SARS Coronavirus 2 by RT PCR: POSITIVE — AB

## 2019-11-04 LAB — C-REACTIVE PROTEIN: CRP: 25.9 mg/dL — ABNORMAL HIGH (ref <1.0)

## 2019-11-04 LAB — D-DIMER, QUANTITATIVE: D-Dimer, Quant: 1.51 ug/mL-FEU — ABNORMAL HIGH (ref 0.00–0.50)

## 2019-11-04 LAB — ABO/RH: ABO/RH(D): O POS

## 2019-11-04 LAB — TYPE AND SCREEN
ABO/RH(D): O POS
Antibody Screen: NEGATIVE

## 2019-11-04 LAB — LACTIC ACID, PLASMA: Lactic Acid, Venous: 1.9 mmol/L (ref 0.5–1.9)

## 2019-11-04 MED ORDER — DEXAMETHASONE SODIUM PHOSPHATE 10 MG/ML IJ SOLN
6.0000 mg | Freq: Two times a day (BID) | INTRAMUSCULAR | Status: DC
  Administered 2019-11-04 – 2019-11-06 (×5): 6 mg via INTRAVENOUS
  Filled 2019-11-04 (×5): qty 1

## 2019-11-04 MED ORDER — SODIUM CHLORIDE 0.9 % IV SOLN: INTRAVENOUS | Status: DC

## 2019-11-04 MED ORDER — INFLUENZA VAC SPLIT QUAD 0.5 ML IM SUSY
0.5000 mL | PREFILLED_SYRINGE | INTRAMUSCULAR | Status: DC | PRN
  Filled 2019-11-04: qty 0.5

## 2019-11-04 MED ORDER — FAMOTIDINE 20 MG PO TABS
20.0000 mg | ORAL_TABLET | Freq: Every day | ORAL | Status: DC
  Administered 2019-11-04 – 2019-11-07 (×4): 20 mg via ORAL
  Filled 2019-11-04 (×4): qty 1

## 2019-11-04 MED ORDER — IPRATROPIUM-ALBUTEROL 20-100 MCG/ACT IN AERS
4.0000 | INHALATION_SPRAY | Freq: Four times a day (QID) | RESPIRATORY_TRACT | Status: DC
  Administered 2019-11-04 – 2019-11-07 (×13): 4 via RESPIRATORY_TRACT
  Filled 2019-11-04: qty 4

## 2019-11-04 NOTE — Plan of Care (Signed)
  Problem: Education: Goal: Knowledge of risk factors and measures for prevention of condition will improve 11/04/2019 1007 by Shelton Silvas, RN Outcome: Progressing 11/04/2019 1007 by Shelton Silvas, RN Outcome: Progressing   Problem: Coping: Goal: Psychosocial and spiritual needs will be supported 11/04/2019 1007 by Shelton Silvas, RN Outcome: Progressing 11/04/2019 1007 by Shelton Silvas, RN Outcome: Progressing   Problem: Respiratory: Goal: Will maintain a patent airway 11/04/2019 1007 by Shelton Silvas, RN Outcome: Progressing 11/04/2019 1007 by Shelton Silvas, RN Outcome: Progressing Goal: Complications related to the disease process, condition or treatment will be avoided or minimized 11/04/2019 1007 by Shelton Silvas, RN Outcome: Progressing 11/04/2019 1007 by Shelton Silvas, RN Outcome: Progressing

## 2019-11-04 NOTE — ED Notes (Signed)
[facility] called for transportation. Paperwork at nurses station. 

## 2019-11-04 NOTE — Progress Notes (Signed)
Donald Trujillo, No Pcp Per  Brief History/Interval Summary: 53 y.o. male with no medical history tested positive for Covid at [facility] in [place] last week. Was seen in the emergency department on Christmas Day and was started on azithromycin.  Presented due to worsening shortness of breath.  He was noted to be hypoxic.  He was hospitalized for further management.   Reason for Visit: Acute respiratory failure with hypoxia.  Pneumonia due to COVID-19.  Consultants: None  Procedures: None  Antibiotics: Anti-infectives (From admission, onward)   Start     Dose/Rate Route Frequency Ordered Stop   11/04/19 1000  remdesivir 100 mg in sodium chloride 0.9 % 100 mL IVPB     100 mg 200 mL/hr over 30 Minutes Intravenous Daily 11/03/19 2314 11/08/19 0959   11/03/19 2315  remdesivir 200 mg in sodium chloride 0.9% 250 mL IVPB     200 mg 580 mL/hr over 30 Minutes Intravenous Once 11/03/19 2314 11/04/19 0206      Subjective/Interval History: Interpreter services were utilized.  Trujillo continues to have shortness of breath and a dry cough.  Occasional chest pain when he takes a deep breath or coughs.  Denies any nausea vomiting.  Somewhat of a poor historian.    Assessment/Plan:  Acute Hypoxic Resp. Failure/Pneumonia due to COVID-19  Recent Labs  Lab 11/03/19 2202 11/04/19 0500  DDIMER 1.55* 1.51*  FERRITIN 630*  --   CRP 28.8* 25.9*  ALT 76*  --   PROCALCITON 0.57  --     Objective findings: Fever: Afebrile Oxygen requirements: Nasal cannula.  3 L.  Saturating in the early 90s.  COVID 19 Therapeutics: Antibacterials: Recently completed course of azithromycin Remdesivir: Day 2 Steroids: Dexamethasone currently 6 mg once a day.  Will change to twice a day. Diuretics: None Actemra: Trujillo mentions history of tuberculosis.  We will hold off on Actemra for now. Convalescent Plasma: Trujillo  declines Vitamin C and Zinc: Continue PUD Prophylaxis: Initiate Pepcid DVT Prophylaxis:  Lovenox   Trujillo requiring about 3 L of oxygen.  He is stable from a respiratory standpoint.  CRP is significantly elevated.  It was 28.8 yesterday and is noted to be 25.9 today.  D-dimer 1.51.  Procalcitonin was 0.57.  Continue with remdesivir.  Increase dexamethasone to twice a day.  Incentive spirometry, mobilization, prone positioning as much as possible.  Trujillo reports history of tuberculosis and so we will not offer Actemra at this time.  If Trujillo's respiratory status were to worsen and his oxygen requirements go be on 5 L then we can offer it to him and will need to check QuantiFERON if it comes to that.  Convalescent plasma was discussed with the Trujillo.  He was provided with the consent forms in [place].  He read through the forms.  He was given an opportunity to ask questions.  At this time Trujillo declines convalescent plasma.  He does not give any reason for the same.  Transaminitis Most likely secondary to COVID-19.  Continue to monitor.  Mild hyponatremia Possibly due to dehydration.  Give him IV fluids for 24 hours.   DVT Prophylaxis: Lovenox Code Status: Full code Family Communication: Discussed with the Trujillo. Disposition Plan: Hopefully will be able to return home when improved.   Medications:  Scheduled: . vitamin C  500 mg Oral Daily  . dexamethasone (DECADRON) injection  6 mg Intravenous QHS  . enoxaparin (LOVENOX) injection  40 mg Subcutaneous QHS  . sodium chloride flush  3 mL Intravenous Q12H  . zinc sulfate  220 mg Oral Daily   Continuous: . sodium chloride    . remdesivir 100 mg in NS 100 mL 100 mg (11/04/19 0929)   MWU:XLKGMW chloride, acetaminophen, chlorpheniramine-HYDROcodone, guaiFENesin-dextromethorphan, influenza vac split quadrivalent PF, ondansetron **OR** ondansetron (ZOFRAN) IV, sodium chloride flush   Objective:  Vital Signs  Vitals:    11/04/19 0657 11/04/19 0700 11/04/19 0830 11/04/19 1158  BP: 134/81 130/87 132/85 (!) 121/92  Pulse: 73 75    Resp: 20 (!) 28 (!) 30 20  Temp: 98.3 F (36.8 C)  97.6 F (36.4 C) 97.7 F (36.5 C)  TempSrc: Oral  Oral Oral  SpO2: 98% 98%  96%  Weight:      Height:        Intake/Output Summary (Last 24 hours) at 11/04/2019 1302 Last data filed at 11/04/2019 0206 Gross per 24 hour  Intake 250 ml  Output --  Net 250 ml   Filed Weights   11/03/19 2137  Weight: 77.6 kg    General appearance: Awake alert.  In no distress Resp: Tachypneic at rest.  No use of accessory muscles.  Crackles at the bases bilaterally.  No wheezing or rhonchi. Cardio: S1-S2 is normal regular.  No S3-S4.  No rubs murmurs or bruit GI: Abdomen is soft.  Nontender nondistended.  Bowel sounds are present normal.  No masses organomegaly Extremities: No edema.  Full range of motion of lower extremities. Neurologic: Alert and oriented x3.  No focal neurological deficits.    Lab Results:  Data Reviewed: I have personally reviewed following labs and imaging studies  CBC: Recent Labs  Lab 11/03/19 2202 11/04/19 0500  WBC 13.2* 9.2  NEUTROABS 10.7* 8.0*  HGB 14.1 13.6  HCT 41.1 40.3  MCV 90.7 91.0  PLT 426* 102    Basic Metabolic Panel: Recent Labs  Lab 11/03/19 2202  NA 132*  K 3.8  CL 97*  CO2 21*  GLUCOSE 103*  BUN 11  CREATININE 0.70  CALCIUM 8.3*    GFR: Estimated Creatinine Clearance: 103.7 mL/min (by C-G formula based on SCr of 0.7 mg/dL).  Liver Function Tests: Recent Labs  Lab 11/03/19 2202  AST 70*  ALT 76*  ALKPHOS 251*  BILITOT 1.0  PROT 7.5  ALBUMIN 2.4*     Lipid Profile: Recent Labs    11/03/19 2202  TRIG 248*    Anemia Panel: Recent Labs    11/03/19 2202  FERRITIN 630*    Recent Results (from the past 240 hour(s))  Blood Culture (routine x 2)     Status: None (Preliminary result)   Collection Time: 11/03/19 10:02 PM   Specimen: BLOOD  Result Value  Ref Range Status   Specimen Description   Final    BLOOD RIGHT ANTECUBITAL Performed at [facility], [place] [address], [place], [place] [address]    Special Requests   Final    BOTTLES DRAWN AEROBIC AND ANAEROBIC Blood Culture results may not be optimal due to an excessive volume of blood received in culture bottles Performed at [facility] [address], [place], [place] [address]    Culture   Final    NO GROWTH < 12 HOURS Performed at [facility] [address], [place], [place] [address]    Report Status PENDING  Incomplete  Blood Culture (routine x 2)     Status: None (Preliminary result)  Collection Time: 11/03/19 11:18 PM   Specimen: BLOOD  Result Value Ref Range Status   Specimen Description   Final    BLOOD LEFT ANTECUBITAL Performed at [facility], [address] [place], [place] [address]    Special Requests   Final    BOTTLES DRAWN AEROBIC AND ANAEROBIC Blood Culture adequate volume Performed at [facility], [address] [place], [place] [address]    Culture   Final    NO GROWTH < 12 HOURS Performed at [facility], [address], [place], [place] [address]    Report Status PENDING  Incomplete  Respiratory Panel by RT PCR (Flu A&B, Covid) - Nasopharyngeal Swab     Status: Abnormal   Collection Time: 11/04/19  3:32 AM   Specimen: Nasopharyngeal Swab  Result Value Ref Range Status   SARS Coronavirus 2 by RT PCR POSITIVE (A) NEGATIVE Final    Comment: RESULT CALLED TO, READ BACK BY AND VERIFIED WITH: RN CELINE AT 0503 11/04/19 CRUICKSHANK A (NOTE) SARS-CoV-2 target nucleic acids are DETECTED. SARS-CoV-2 RNA is generally detectable in upper respiratory specimens  during the acute phase of infection. Positive results are indicative of the presence of the identified virus, but do not rule out bacterial infection or co-infection with other pathogens not detected by the  test. Clinical correlation with Trujillo history and other diagnostic information is necessary to determine Trujillo infection status. The expected result is Negative. Fact Sheet for Patients:  https://www.moore.com/ Fact Sheet for Healthcare Providers: https://www.young.biz/ This test is not yet approved or cleared by the [place] FDA and  has been authorized for detection and/or diagnosis of SARS-CoV-2 by FDA under an Emergency Use Authorization (EUA).  This EUA will remain in effect (meaning this test can be [place] ed) for the duration of  the COVID-19 declaration under Section 564(b)(1) of the Act, 21 U.S.C. section 360bbb-3(b)(1), unless the authorization is terminated or revoked sooner.    Influenza A by PCR NEGATIVE NEGATIVE Final   Influenza B by PCR NEGATIVE NEGATIVE Final    Comment: (NOTE) The Xpert Xpress SARS-CoV-2/FLU/RSV assay is intended as an aid in  the diagnosis of influenza from Nasopharyngeal swab specimens and  should not be used as a sole basis for treatment. Nasal washings and  aspirates are unacceptable for Xpert Xpress SARS-CoV-2/FLU/RSV  testing. Fact Sheet for Patients: https://www.moore.com/ Fact Sheet for Healthcare Providers: https://www.young.biz/ This test is not yet approved or cleared by the [place] FDA and  has been authorized for detection and/or diagnosis of SARS-CoV-2 by  FDA under an Emergency Use Authorization (EUA). This EUA will remain  in effect (meaning this test can be used) for the duration of the  Covid-19 declaration under Section 564(b)(1) of the Act, 21  U.S.C. section 360bbb-3(b)(1), unless the authorization is  terminated or revoked. Performed at [facility], [address] [place], [place] [address]       Radiology Studies: [facility] Chest Port 1 View  Result Date: 11/03/2019 CLINICAL DATA:  Shortness of breath, COVID positive  EXAM: PORTABLE CHEST 1 VIEW COMPARISON:  10/31/2019 FINDINGS: Low lung volumes. Interval worsening of bilateral airspace disease. Normal heart size. No pneumothorax. IMPRESSION: Interval worsening of bilateral multifocal pneumonia. Electronically Signed   By: Jasmine Pang M.D.   On: 11/03/2019 22:35       LOS: 1 day   Sumedh Shinsato  [facility] Pager on [email or web address]  11/04/2019, 1:02 PM

## 2019-11-04 NOTE — Progress Notes (Signed)
Weakness due to covid

## 2019-11-04 NOTE — Progress Notes (Addendum)
1900 Report from [place] interpreter used for shift assessment and medication pass. VS WDL. 3L [place]. Call bell in reach.  [place] IV placed   0000 Rounds on pt, VS WDL  0200 Rounds on pt, pt asleep in bed  0400 Rounds on pt, VS WDL.  0600 Rounds on pt, pt asleep in bed  0700 Report given to [facility]

## 2019-11-04 NOTE — ED Notes (Signed)
Attempted to call report x 1.  Will call back. 

## 2019-11-04 NOTE — Plan of Care (Signed)
  Problem: Education: Goal: Knowledge of risk factors and measures for prevention of condition will improve Outcome: Progressing   Problem: Coping: Goal: Psychosocial and spiritual needs will be supported Outcome: Progressing   Problem: Respiratory: Goal: Will maintain a patent airway Outcome: Progressing Goal: Complications related to the disease process, condition or treatment will be avoided or minimized Outcome: Progressing   

## 2019-11-05 LAB — CBC WITH DIFFERENTIAL/PLATELET
Abs Immature Granulocytes: 0.18 10*3/uL — ABNORMAL HIGH (ref 0.00–0.07)
Basophils Absolute: 0 10*3/uL (ref 0.0–0.1)
Basophils Relative: 0 %
Eosinophils Absolute: 0 10*3/uL (ref 0.0–0.5)
Eosinophils Relative: 0 %
HCT: 38.6 % — ABNORMAL LOW (ref 39.0–52.0)
Hemoglobin: 13 g/dL (ref 13.0–17.0)
Immature Granulocytes: 1 %
Lymphocytes Relative: 7 %
Lymphs Abs: 1 10*3/uL (ref 0.7–4.0)
MCH: 30.7 pg (ref 26.0–34.0)
MCHC: 33.7 g/dL (ref 30.0–36.0)
MCV: 91 fL (ref 80.0–100.0)
Monocytes Absolute: 0.5 10*3/uL (ref 0.1–1.0)
Monocytes Relative: 3 %
Neutro Abs: 13.6 10*3/uL — ABNORMAL HIGH (ref 1.7–7.7)
Neutrophils Relative %: 89 %
Platelets: 531 10*3/uL — ABNORMAL HIGH (ref 150–400)
RBC: 4.24 MIL/uL (ref 4.22–5.81)
RDW: 13.9 % (ref 11.5–15.5)
WBC: 15.3 10*3/uL — ABNORMAL HIGH (ref 4.0–10.5)
nRBC: 0 % (ref 0.0–0.2)

## 2019-11-05 LAB — COMPREHENSIVE METABOLIC PANEL
ALT: 112 U/L — ABNORMAL HIGH (ref 0–44)
AST: 109 U/L — ABNORMAL HIGH (ref 15–41)
Albumin: 2.2 g/dL — ABNORMAL LOW (ref 3.5–5.0)
Alkaline Phosphatase: 267 U/L — ABNORMAL HIGH (ref 38–126)
Anion gap: 11 (ref 5–15)
BUN: 22 mg/dL — ABNORMAL HIGH (ref 6–20)
CO2: 23 mmol/L (ref 22–32)
Calcium: 8.4 mg/dL — ABNORMAL LOW (ref 8.9–10.3)
Chloride: 103 mmol/L (ref 98–111)
Creatinine, Ser: 0.63 mg/dL (ref 0.61–1.24)
GFR calc Af Amer: >60 mL/min (ref >60)
GFR calc non Af Amer: >60 mL/min (ref >60)
Glucose, Bld: 125 mg/dL — ABNORMAL HIGH (ref 70–99)
Potassium: 4 mmol/L (ref 3.5–5.1)
Sodium: 137 mmol/L (ref 135–145)
Total Bilirubin: 0.8 mg/dL (ref 0.3–1.2)
Total Protein: 7.1 g/dL (ref 6.5–8.1)

## 2019-11-05 LAB — MAGNESIUM: Magnesium: 2.4 mg/dL (ref 1.7–2.4)

## 2019-11-05 LAB — D-DIMER, QUANTITATIVE: D-Dimer, Quant: 0.88 ug/mL-FEU — ABNORMAL HIGH (ref 0.00–0.50)

## 2019-11-05 LAB — C-REACTIVE PROTEIN: CRP: 19.3 mg/dL — ABNORMAL HIGH (ref <1.0)

## 2019-11-05 NOTE — Progress Notes (Signed)
   11/05/19 1810  Family/Significant Other Communication  Family/Significant Other Update Called (Call to daughter Beverlee Nims, no answer. VM left with CB number)

## 2019-11-05 NOTE — Plan of Care (Signed)
  Problem: Education: Goal: Knowledge of risk factors and measures for prevention of condition will improve Outcome: Progressing   Problem: Coping: Goal: Psychosocial and spiritual needs will be supported Outcome: Progressing   Problem: Respiratory: Goal: Will maintain a patent airway Outcome: Progressing Goal: Complications related to the disease process, condition or treatment will be avoided or minimized Outcome: Progressing   

## 2019-11-05 NOTE — Progress Notes (Addendum)
PROGRESS NOTE  Karlo Goeden MWU:[id number] DOB: [birth year] DOA: 11/03/2019  PCP: Patient, No Pcp Per  Brief History/Interval Summary: 53 y.o. male with no medical history tested positive for Covid at [facility] in [place] last week. Was seen in the emergency department on Christmas Day and was started on azithromycin.  Presented due to worsening shortness of breath.  He was noted to be hypoxic.  He was hospitalized for further management.   Reason for Visit: Acute respiratory failure with hypoxia.  Pneumonia due to COVID-19.  Consultants: None  Procedures: None  Antibiotics: Anti-infectives (From admission, onward)   Start     Dose/Rate Route Frequency Ordered Stop   11/04/19 1000  remdesivir 100 mg in sodium chloride 0.9 % 100 mL IVPB     100 mg 200 mL/hr over 30 Minutes Intravenous Daily 11/03/19 2314 11/08/19 0959   11/03/19 2315  remdesivir 200 mg in sodium chloride 0.9% 250 mL IVPB     200 mg 580 mL/hr over 30 Minutes Intravenous Once 11/03/19 2314 11/04/19 0206      Subjective/Interval History: Patient mentions that he is feeling better this morning.  Continues to have some difficulty breathing but not as much as yesterday.  No chest pain.  Denies any nausea vomiting.  Occasional dry cough.      Assessment/Plan:  Acute Hypoxic Resp. Failure/Pneumonia due to COVID-19  Recent Labs  Lab 11/03/19 2202 11/04/19 0500 11/05/19 0110 11/05/19 0255  DDIMER 1.55* 1.51*  --  0.88*  FERRITIN 630*  --   --   --   CRP 28.8* 25.9* 19.3*  --   ALT 76*  --   --  112*  PROCALCITON 0.57  --   --   --     Objective findings: Fever: Remains afebrile Oxygen requirements: Nasal cannula.  2 to 3 L.  Saturating in the mid 90s.    COVID 19 Therapeutics: Antibacterials: Recently completed course of azithromycin Remdesivir: Day 3 Steroids: Dexamethasone 6 mg twice a day Diuretics: None Actemra: Patient mentions history of tuberculosis.  We will hold off on Actemra for now.  Convalescent Plasma: Patient declines Vitamin C and Zinc: Continue PUD Prophylaxis: Initiate Pepcid DVT Prophylaxis:  Lovenox   Patient's respiratory status is stable.  He is requiring 2 to 3 L of oxygen.  CRP has improved to 19.3 from a peak of 28.8.  D-dimer 0.88.  Procalcitonin was 0.57.  He recently completed a course of azithromycin.  Holding off on antibacterials.  Continue with incentive spirometry, mobilization, prone positioning, out of bed to chair.  Leukocytosis is due to steroids.  Patient reported history of tuberculosis.  For this reason as well as the fact that he was improving he was not offered any Actemra.  If he does need it in the next day or so then will need to QuantiFERON.  Patient also declined plasma yesterday.  Transaminitis Likely secondary to COVID-19.  Increase in AST and ALT noted.  Continue to trend.  Check hepatitis panel.  Mild hyponatremia Possibly due to dehydration.  Improved with IV fluids. Stop IV fluids today.Marland Kitchen   DVT Prophylaxis: Lovenox Code Status: Full code Family Communication: Discussed with the patient. Disposition Plan: Hopefully will be able to return home when improved.  Mobilize.   Medications:  Scheduled: . vitamin C  500 mg Oral Daily  . dexamethasone (DECADRON) injection  6 mg Intravenous Q12H  . enoxaparin (LOVENOX) injection  40 mg Subcutaneous QHS  . famotidine  20 mg Oral Daily  .  Ipratropium-Albuterol  4 puff Inhalation QID  . sodium chloride flush  3 mL Intravenous Q12H  . zinc sulfate  220 mg Oral Daily   Continuous: . sodium chloride    . sodium chloride 75 mL/hr at 11/05/19 0700  . remdesivir 100 mg in NS 100 mL 100 mg (11/05/19 0936)   QIH:KVQQVZ chloride, acetaminophen, chlorpheniramine-HYDROcodone, guaiFENesin-dextromethorphan, influenza vac split quadrivalent PF, ondansetron **OR** ondansetron (ZOFRAN) IV, sodium chloride flush   Objective:  Vital Signs  Vitals:   11/04/19 2000 11/05/19 0000 11/05/19 0400  11/05/19 1031  BP: 125/90 118/80 (!) 114/95 127/76  Pulse: 87 80 61 96  Resp: (!) 21 (!) 26 20 (!) 24  Temp: (!) 97 F (36.1 C) (!) 97.3 F (36.3 C) 98.2 F (36.8 C) 97.8 F (36.6 C)  TempSrc: Oral Oral Oral Oral  SpO2: 95% 92% 93% 96%  Weight:      Height:        Intake/Output Summary (Last 24 hours) at 11/05/2019 1224 Last data filed at 11/05/2019 0700 Gross per 24 hour  Intake 2131.42 ml  Output 400 ml  Net 1731.42 ml   Filed Weights   11/03/19 2137  Weight: 77.6 kg    General appearance: Awake alert.  In no distress Resp: Improved effort.  Crackles at the bases bilaterally.  No wheezing or rhonchi. Cardio: S1-S2 is normal regular.  No S3-S4.  No rubs murmurs or bruit GI: Abdomen is soft.  Nontender nondistended.  Bowel sounds are present normal.  No masses organomegaly Extremities: No edema.  Full range of motion of lower extremities. Neurologic: Alert and oriented x3.  No focal neurological deficits.     Lab Results:  Data Reviewed: I have personally reviewed following labs and imaging studies  CBC: Recent Labs  Lab 11/03/19 2202 11/04/19 0500 11/05/19 0255  WBC 13.2* 9.2 15.3*  NEUTROABS 10.7* 8.0* 13.6*  HGB 14.1 13.6 13.0  HCT 41.1 40.3 38.6*  MCV 90.7 91.0 91.0  PLT 426* 328 531*    Basic Metabolic Panel: Recent Labs  Lab 11/03/19 2202 11/05/19 0255  NA 132* 137  K 3.8 4.0  CL 97* 103  CO2 21* 23  GLUCOSE 103* 125*  BUN 11 22*  CREATININE 0.70 0.63  CALCIUM 8.3* 8.4*  MG  --  2.4    GFR: Estimated Creatinine Clearance: 103.7 mL/min (by C-G formula based on SCr of 0.63 mg/dL).  Liver Function Tests: Recent Labs  Lab 11/03/19 2202 11/05/19 0255  AST 70* 109*  ALT 76* 112*  ALKPHOS 251* 267*  BILITOT 1.0 0.8  PROT 7.5 7.1  ALBUMIN 2.4* 2.2*     Lipid Profile: Recent Labs    11/03/19 2202  TRIG 248*    Anemia Panel: Recent Labs    11/03/19 2202  FERRITIN 630*    Recent Results (from the past 240 hour(s))  Blood  Culture (routine x 2)     Status: None (Preliminary result)   Collection Time: 11/03/19 10:02 PM   Specimen: BLOOD  Result Value Ref Range Status   Specimen Description   Final    BLOOD RIGHT ANTECUBITAL Performed at [facility], [address] [place], [place] [address]    Special Requests   Final    BOTTLES DRAWN AEROBIC AND ANAEROBIC Blood Culture results may not be optimal due to an excessive volume of blood received in culture bottles Performed at [facility], [address] [place], [place] [address]    Culture  Final    NO GROWTH 1 DAY Performed at [facility], [place] [address], [place], [place] [address]    Report Status PENDING  Incomplete  Blood Culture (routine x 2)     Status: None (Preliminary result)   Collection Time: 11/03/19 11:18 PM   Specimen: BLOOD  Result Value Ref Range Status   Specimen Description   Final    BLOOD LEFT ANTECUBITAL Performed at [place] [address] [place], [place] [address]    Special Requests   Final    BOTTLES DRAWN AEROBIC AND ANAEROBIC Blood Culture adequate volume Performed at [place] [address] [place], [place] [address]    Culture   Final    NO GROWTH 1 DAY Performed at [facility], [place] [address], [place], [place] [address]    Report Status PENDING  Incomplete  Respiratory Panel by RT PCR (Flu A&B, Covid) - Nasopharyngeal Swab     Status: Abnormal   Collection Time: 11/04/19  3:32 AM   Specimen: Nasopharyngeal Swab  Result Value Ref Range Status   SARS Coronavirus 2 by RT PCR POSITIVE (A) NEGATIVE Final    Comment: RESULT CALLED TO, READ BACK BY AND VERIFIED WITH: RN CELINE AT 0503 11/04/19 CRUICKSHANK A (NOTE) SARS-CoV-2 target nucleic acids are DETECTED. SARS-CoV-2 RNA is generally detectable in upper respiratory specimens  during the acute phase of infection. Positive results are indicative of the presence of  the identified virus, but do not rule out bacterial infection or co-infection with other pathogens not detected by the test. Clinical correlation with patient history and other diagnostic information is necessary to determine patient infection status. The expected result is Negative. Fact Sheet for Patients:  PinkCheek.be Fact Sheet for Healthcare Providers: GravelBags.it This test is not yet approved or cleared by the [place] FDA and  has been authorized for detection and/or diagnosis of SARS-CoV-2 by FDA under an Emergency Use Authorization (EUA).  This EUA will remain in effect (meaning this test can be [place] ed) for the duration of  the COVID-19 declaration under Section 564(b)(1) of the Act, 21 U.S.C. section 360bbb-3(b)(1), unless the authorization is terminated or revoked sooner.    Influenza A by PCR NEGATIVE NEGATIVE Final   Influenza B by PCR NEGATIVE NEGATIVE Final    Comment: (NOTE) The Xpert Xpress SARS-CoV-2/FLU/RSV assay is intended as an aid in  the diagnosis of influenza from Nasopharyngeal swab specimens and  should not be used as a sole basis for treatment. Nasal washings and  aspirates are unacceptable for Xpert Xpress SARS-CoV-2/FLU/RSV  testing. Fact Sheet for Patients: PinkCheek.be Fact Sheet for Healthcare Providers: GravelBags.it This test is not yet approved or cleared by the [place] FDA and  has been authorized for detection and/or diagnosis of SARS-CoV-2 by  FDA under an Emergency Use Authorization (EUA). This EUA will remain  in effect (meaning this test can be used) for the duration of the  Covid-19 declaration under Section 564(b)(1) of the Act, 21  U.S.C. section 360bbb-3(b)(1), unless the authorization is  terminated or revoked. Performed at [facility], [place] [address] [place],  [address]        Radiology Studies: [facility] Chest Port 1 View  Result Date: 11/03/2019 CLINICAL DATA:  Shortness of breath, COVID positive EXAM: PORTABLE CHEST 1 VIEW COMPARISON:  10/31/2019 FINDINGS: Low lung volumes. Interval worsening of bilateral airspace disease. Normal heart size. No pneumothorax. IMPRESSION: Interval worsening of bilateral  multifocal pneumonia. Electronically Signed   By: Jasmine PangKim  Fujinaga M.D.   On: 11/03/2019 22:35       LOS: 2 days   Jhanvi Drakeford Rito EhrlichKrishnan  [facility] Pager on [email or web address]  11/05/2019, 12:24 PM

## 2019-11-05 NOTE — Progress Notes (Signed)
Patient arrived to room 316 on 2L [place]. Noted to be slightly diaphoretic, no signs of distress. VSS. Personal items placed at bedside.  11/05/19 1040  Patient Belongings  Patient/Family advised about valuables policy? Yes  Belongings at Bedside Clothing;Other (Comment);Cell phone (shoes, jacket, charger)

## 2019-11-06 DIAGNOSIS — J9600 Acute respiratory failure, unspecified whether with hypoxia or hypercapnia: Secondary | ICD-10-CM

## 2019-11-06 LAB — COMPREHENSIVE METABOLIC PANEL
ALT: 114 U/L — ABNORMAL HIGH (ref 0–44)
AST: 71 U/L — ABNORMAL HIGH (ref 15–41)
Albumin: 2.3 g/dL — ABNORMAL LOW (ref 3.5–5.0)
Alkaline Phosphatase: 236 U/L — ABNORMAL HIGH (ref 38–126)
Anion gap: 14 (ref 5–15)
BUN: 17 mg/dL (ref 6–20)
CO2: 22 mmol/L (ref 22–32)
Calcium: 8.2 mg/dL — ABNORMAL LOW (ref 8.9–10.3)
Chloride: 99 mmol/L (ref 98–111)
Creatinine, Ser: 0.51 mg/dL — ABNORMAL LOW (ref 0.61–1.24)
GFR calc Af Amer: >60 mL/min (ref >60)
GFR calc non Af Amer: >60 mL/min (ref >60)
Glucose, Bld: 92 mg/dL (ref 70–99)
Potassium: 4.2 mmol/L (ref 3.5–5.1)
Sodium: 135 mmol/L (ref 135–145)
Total Bilirubin: 1 mg/dL (ref 0.3–1.2)
Total Protein: 6.2 g/dL — ABNORMAL LOW (ref 6.5–8.1)

## 2019-11-06 LAB — CBC WITH DIFFERENTIAL/PLATELET
Abs Immature Granulocytes: 0.18 10*3/uL — ABNORMAL HIGH (ref 0.00–0.07)
Basophils Absolute: 0.1 10*3/uL (ref 0.0–0.1)
Basophils Relative: 0 %
Eosinophils Absolute: 0 10*3/uL (ref 0.0–0.5)
Eosinophils Relative: 0 %
HCT: 39.4 % (ref 39.0–52.0)
Hemoglobin: 13.1 g/dL (ref 13.0–17.0)
Immature Granulocytes: 1 %
Lymphocytes Relative: 6 %
Lymphs Abs: 1.2 10*3/uL (ref 0.7–4.0)
MCH: 30.8 pg (ref 26.0–34.0)
MCHC: 33.2 g/dL (ref 30.0–36.0)
MCV: 92.5 fL (ref 80.0–100.0)
Monocytes Absolute: 0.5 10*3/uL (ref 0.1–1.0)
Monocytes Relative: 2 %
Neutro Abs: 16.9 10*3/uL — ABNORMAL HIGH (ref 1.7–7.7)
Neutrophils Relative %: 91 %
Platelets: 513 10*3/uL — ABNORMAL HIGH (ref 150–400)
RBC: 4.26 MIL/uL (ref 4.22–5.81)
RDW: 14.2 % (ref 11.5–15.5)
WBC: 18.8 10*3/uL — ABNORMAL HIGH (ref 4.0–10.5)
nRBC: 0 % (ref 0.0–0.2)

## 2019-11-06 LAB — HEPATITIS PANEL, ACUTE
HCV Ab: NONREACTIVE
Hep A IgM: NONREACTIVE
Hep B C IgM: NONREACTIVE
Hepatitis B Surface Ag: NONREACTIVE

## 2019-11-06 LAB — D-DIMER, QUANTITATIVE: D-Dimer, Quant: 0.87 ug/mL-FEU — ABNORMAL HIGH (ref 0.00–0.50)

## 2019-11-06 LAB — C-REACTIVE PROTEIN: CRP: 9.5 mg/dL — ABNORMAL HIGH (ref <1.0)

## 2019-11-06 MED ORDER — DEXAMETHASONE SODIUM PHOSPHATE 10 MG/ML IJ SOLN: 6.0000 mg | INTRAMUSCULAR | Status: DC

## 2019-11-06 NOTE — Progress Notes (Signed)
SATURATION QUALIFICATIONS: (This note is used to comply with regulatory documentation for home oxygen)  Patient Saturations on Room Air at Rest = 100%  Patient Saturations on Room Air while Ambulating 96-100%  Patient Saturations on 0 Liters of oxygen while Ambulating = 96-100%  Please briefly explain why patient needs home oxygen: N/A

## 2019-11-06 NOTE — Progress Notes (Signed)
PROGRESS NOTE  Donald Trujillo [id number] DOB: 09/07/1966 DOA: 11/03/2019  PCP: Patient, No Pcp Per  Brief History/Interval Summary: 53 y.o. male with no medical history tested positive for Covid at [facility] in [place] last week. Was seen in the emergency department on Christmas Day and was started on azithromycin.  Presented due to worsening shortness of breath.  He was noted to be hypoxic.  He was hospitalized for further management.   Reason for Visit: Acute respiratory failure with hypoxia.  Pneumonia due to COVID-19.  Consultants: None  Procedures: None  Antibiotics: Anti-infectives (From admission, onward)   Start     Dose/Rate Route Frequency Ordered Stop   11/04/19 1000  remdesivir 100 mg in sodium chloride 0.9 % 100 mL IVPB     100 mg 200 mL/hr over 30 Minutes Intravenous Daily 11/03/19 2314 11/08/19 0959   11/03/19 2315  remdesivir 200 mg in sodium chloride 0.9% 250 mL IVPB     200 mg 580 mL/hr over 30 Minutes Intravenous Once 11/03/19 2314 11/04/19 0206      Subjective/Interval History: Patient states that he is feeling better.  Not having as much difficulty breathing as he was yesterday.  Occasional dry cough.  No chest pain.       Assessment/Plan:  Acute Hypoxic Resp. Failure/Pneumonia due to COVID-19  Recent Labs  Lab 11/03/19 2202 11/04/19 0500 11/05/19 0110 11/05/19 0255 11/06/19 0421  DDIMER 1.55* 1.51*  --  0.88* 0.87*  FERRITIN 630*  --   --   --   --   CRP 28.8* 25.9* 19.3*  --  9.5*  ALT 76*  --   --  112* 114*  PROCALCITON 0.57  --   --   --   --     Objective findings: Fever: Remains afebrile Oxygen requirements: Nasal cannula.  2 L/min.  Saturating in the mid 90s.    COVID 19 Therapeutics: Antibacterials: Recently completed course of azithromycin Remdesivir: Day 4 Steroids: Dexamethasone 6 mg twice a day Diuretics: None Actemra: Patient mentions history of tuberculosis.  We will hold off on Actemra for now. Convalescent Plasma:  Patient declines Vitamin C and Zinc: Continue PUD Prophylaxis: Pepcid DVT Prophylaxis:  Lovenox   His respiratory status is improving.  He is down to 2 L of oxygen.  Should be able to wean down further.  Check ambulatory pulse oximetry.  CRP is improved to 9.5 from a peak of 28.8.  D-dimer 0.87.  Procalcitonin was 0.57.  He recently completed a course of azithromycin.  Not giving him any of antibacterials here in the hospital.  Continue with incentive spirometry, mobilization, prone positioning, out of bed to chair.  Leukocytosis is due to steroids.  Since he is improving and CRP is improving we will cut back on dose of steroids.  Since clinically patient is getting better we can hold off on offering Actemra.  We were hesitant to use it to begin with because of his history of tuberculosis .  And patient declined convalescent plasma.  Transaminitis Likely secondary to COVID-19.  Continue to monitor.  Titus panel negative.  Mild hyponatremia Possibly due to dehydration.  Improved with IV fluids.  IV fluids were discontinued.     DVT Prophylaxis: Lovenox Code Status: Full code Family Communication: Discussed with the patient. Disposition Plan: Hopefully discharge tomorrow after his fifth dose.  Evaluate for home oxygen.     Medications:  Scheduled: . vitamin C  500 mg Oral Daily  . dexamethasone (DECADRON) injection  6 mg Intravenous Q12H  . enoxaparin (LOVENOX) injection  40 mg Subcutaneous QHS  . famotidine  20 mg Oral Daily  . Ipratropium-Albuterol  4 puff Inhalation QID  . sodium chloride flush  3 mL Intravenous Q12H  . zinc sulfate  220 mg Oral Daily   Continuous: . sodium chloride    . remdesivir 100 mg in NS 100 mL 100 mg (11/06/19 0831)   JOI:NOMVEH chloride, acetaminophen, chlorpheniramine-HYDROcodone, guaiFENesin-dextromethorphan, influenza vac split quadrivalent PF, ondansetron **OR** ondansetron (ZOFRAN) IV, sodium chloride flush   Objective:  Vital Signs  Vitals:     11/05/19 2105 11/06/19 0311 11/06/19 0825 11/06/19 0926  BP: 118/83 126/84 (!) 100/57   Pulse:  64    Resp:  17 18   Temp:  97.7 F (36.5 C) 97.8 F (36.6 C)   TempSrc:  Oral Oral   SpO2:  92% 100% 98%  Weight:      Height:        Intake/Output Summary (Last 24 hours) at 11/06/2019 1519 Last data filed at 11/06/2019 1413 Gross per 24 hour  Intake 693 ml  Output 1425 ml  Net -732 ml   Filed Weights   11/03/19 2137  Weight: 77.6 kg    General appearance: Awake alert.  In no distress Resp: Normal effort at rest.  Few crackles at the bases bilaterally.  No wheezing or rhonchi.  Improved from yesterday.   Cardio: S1-S2 is normal regular.  No S3-S4.  No rubs murmurs or bruit GI: Abdomen is soft.  Nontender nondistended.  Bowel sounds are present normal.  No masses organomegaly Extremities: No edema.  Full range of motion of lower extremities. Neurologic: Alert and oriented x3.  No focal neurological deficits.     Lab Results:  Data Reviewed: I have personally reviewed following labs and imaging studies  CBC: Recent Labs  Lab 11/03/19 2202 11/04/19 0500 11/05/19 0255 11/06/19 0421  WBC 13.2* 9.2 15.3* 18.8*  NEUTROABS 10.7* 8.0* 13.6* 16.9*  HGB 14.1 13.6 13.0 13.1  HCT 41.1 40.3 38.6* 39.4  MCV 90.7 91.0 91.0 92.5  PLT 426* 328 531* 513*    Basic Metabolic Panel: Recent Labs  Lab 11/03/19 2202 11/05/19 0255 11/06/19 0421  NA 132* 137 135  K 3.8 4.0 4.2  CL 97* 103 99  CO2 21* 23 22  GLUCOSE 103* 125* 92  BUN 11 22* 17  CREATININE 0.70 0.63 0.51*  CALCIUM 8.3* 8.4* 8.2*  MG  --  2.4  --     GFR: Estimated Creatinine Clearance: 103.7 mL/min (A) (by C-G formula based on SCr of 0.51 mg/dL (L)).  Liver Function Tests: Recent Labs  Lab 11/03/19 2202 11/05/19 0255 11/06/19 0421  AST 70* 109* 71*  ALT 76* 112* 114*  ALKPHOS 251* 267* 236*  BILITOT 1.0 0.8 1.0  PROT 7.5 7.1 6.2*  ALBUMIN 2.4* 2.2* 2.3*     Lipid Profile: Recent Labs     11/03/19 2202  TRIG 248*    Anemia Panel: Recent Labs    11/03/19 2202  FERRITIN 630*    Recent Results (from the past 240 hour(s))  Blood Culture (routine x 2)     Status: None (Preliminary result)   Collection Time: 11/03/19 10:02 PM   Specimen: BLOOD  Result Value Ref Range Status   Specimen Description   Final    BLOOD RIGHT ANTECUBITAL Performed at [facility], [address], [place], [place] [address]    Special Requests   Final  BOTTLES DRAWN AEROBIC AND ANAEROBIC Blood Culture results may not be optimal due to an excessive volume of blood received in culture bottles Performed at [facility], [place] [address], [place], [place] [address]    Culture   Final    NO GROWTH 2 DAYS Performed at [place] [address], [place], [place] [address]    Report Status PENDING  Incomplete  Blood Culture (routine x 2)     Status: None (Preliminary result)   Collection Time: 11/03/19 11:18 PM   Specimen: BLOOD  Result Value Ref Range Status   Specimen Description   Final    BLOOD LEFT ANTECUBITAL Performed at [place] [address], [place], [place] [address]    Special Requests   Final    BOTTLES DRAWN AEROBIC AND ANAEROBIC Blood Culture adequate volume Performed at [place] [address] [place], [place] [address]    Culture   Final    NO GROWTH 2 DAYS Performed at [place] [address], [place], [place] [address]    Report Status PENDING  Incomplete  Respiratory Panel by RT PCR (Flu A&B, Covid) - Nasopharyngeal Swab     Status: Abnormal   Collection Time: 11/04/19  3:32 AM   Specimen: Nasopharyngeal Swab  Result Value Ref Range Status   SARS Coronavirus 2 by RT PCR POSITIVE (A) NEGATIVE Final    Comment: RESULT CALLED TO, READ BACK BY AND VERIFIED WITH: RN CELINE AT 0503 11/04/19 CRUICKSHANK A (NOTE) SARS-CoV-2 target nucleic acids are  DETECTED. SARS-CoV-2 RNA is generally detectable in upper respiratory specimens  during the acute phase of infection. Positive results are indicative of the presence of the identified virus, but do not rule out bacterial infection or co-infection with other pathogens not detected by the test. Clinical correlation with patient history and other diagnostic information is necessary to determine patient infection status. The expected result is Negative. Fact Sheet for Patients:  PinkCheek.be Fact Sheet for Healthcare Providers: GravelBags.it This test is not yet approved or cleared by the [place] FDA and  has been authorized for detection and/or diagnosis of SARS-CoV-2 by FDA under an Emergency Use Authorization (EUA).  This EUA will remain in effect (meaning this test can be [place] ed) for the duration of  the COVID-19 declaration under Section 564(b)(1) of the Act, 21 U.S.C. section 360bbb-3(b)(1), unless the authorization is terminated or revoked sooner.    Influenza A by PCR NEGATIVE NEGATIVE Final   Influenza B by PCR NEGATIVE NEGATIVE Final    Comment: (NOTE) The Xpert Xpress SARS-CoV-2/FLU/RSV assay is intended as an aid in  the diagnosis of influenza from Nasopharyngeal swab specimens and  should not be used as a sole basis for treatment. Nasal washings and  aspirates are unacceptable for Xpert Xpress SARS-CoV-2/FLU/RSV  testing. Fact Sheet for Patients: PinkCheek.be Fact Sheet for Healthcare Providers: GravelBags.it This test is not yet approved or cleared by the [place] FDA and  has been authorized for detection and/or diagnosis of SARS-CoV-2 by  FDA under an Emergency Use Authorization (EUA). This EUA will remain  in effect (meaning this test can be used) for the duration of the  Covid-19 declaration under Section 564(b)(1) of the Act, 21  U.S.C.  section 360bbb-3(b)(1), unless the authorization is  terminated or revoked. Performed at [facility], [place] [address] [place],  [address]       Radiology Studies: No results found.  LOS: 3 days   Stanislaus Kaltenbach [employer] on [email or web address]  11/06/2019, 3:19 PM

## 2019-11-06 NOTE — Progress Notes (Signed)
   11/06/19 1640  Family/Significant Other Communication  Family/Significant Other Update Called;Updated (daughter. Updated daughter Beverlee Nims.)

## 2019-11-07 DIAGNOSIS — U071 COVID-19: Principal | ICD-10-CM

## 2019-11-07 DIAGNOSIS — J1282 Pneumonia due to coronavirus disease 2019: Secondary | ICD-10-CM

## 2019-11-07 LAB — C-REACTIVE PROTEIN: CRP: 4.6 mg/dL — ABNORMAL HIGH (ref <1.0)

## 2019-11-07 LAB — COMPREHENSIVE METABOLIC PANEL
ALT: 133 U/L — ABNORMAL HIGH (ref 0–44)
AST: 81 U/L — ABNORMAL HIGH (ref 15–41)
Albumin: 2.3 g/dL — ABNORMAL LOW (ref 3.5–5.0)
Alkaline Phosphatase: 206 U/L — ABNORMAL HIGH (ref 38–126)
Anion gap: 11 (ref 5–15)
BUN: 14 mg/dL (ref 6–20)
CO2: 24 mmol/L (ref 22–32)
Calcium: 8.4 mg/dL — ABNORMAL LOW (ref 8.9–10.3)
Chloride: 103 mmol/L (ref 98–111)
Creatinine, Ser: 0.52 mg/dL — ABNORMAL LOW (ref 0.61–1.24)
GFR calc Af Amer: >60 mL/min (ref >60)
GFR calc non Af Amer: >60 mL/min (ref >60)
Glucose, Bld: 76 mg/dL (ref 70–99)
Potassium: 4.1 mmol/L (ref 3.5–5.1)
Sodium: 138 mmol/L (ref 135–145)
Total Bilirubin: 0.7 mg/dL (ref 0.3–1.2)
Total Protein: 5.9 g/dL — ABNORMAL LOW (ref 6.5–8.1)

## 2019-11-07 LAB — CBC WITH DIFFERENTIAL/PLATELET
Abs Immature Granulocytes: 0.5 10*3/uL — ABNORMAL HIGH (ref 0.00–0.07)
Basophils Absolute: 0.1 10*3/uL (ref 0.0–0.1)
Basophils Relative: 0 %
Eosinophils Absolute: 0 10*3/uL (ref 0.0–0.5)
Eosinophils Relative: 0 %
HCT: 39 % (ref 39.0–52.0)
Hemoglobin: 13 g/dL (ref 13.0–17.0)
Immature Granulocytes: 3 %
Lymphocytes Relative: 17 %
Lymphs Abs: 2.7 10*3/uL (ref 0.7–4.0)
MCH: 30.2 pg (ref 26.0–34.0)
MCHC: 33.3 g/dL (ref 30.0–36.0)
MCV: 90.7 fL (ref 80.0–100.0)
Monocytes Absolute: 1 10*3/uL (ref 0.1–1.0)
Monocytes Relative: 6 %
Neutro Abs: 12.1 10*3/uL — ABNORMAL HIGH (ref 1.7–7.7)
Neutrophils Relative %: 74 %
Platelets: 624 10*3/uL — ABNORMAL HIGH (ref 150–400)
RBC: 4.3 MIL/uL (ref 4.22–5.81)
RDW: 14.1 % (ref 11.5–15.5)
WBC: 16.4 10*3/uL — ABNORMAL HIGH (ref 4.0–10.5)
nRBC: 0.2 % (ref 0.0–0.2)

## 2019-11-07 LAB — D-DIMER, QUANTITATIVE: D-Dimer, Quant: 0.98 ug/mL-FEU — ABNORMAL HIGH (ref 0.00–0.50)

## 2019-11-07 MED ORDER — ALBUTEROL SULFATE HFA 108 (90 BASE) MCG/ACT IN AERS: 2.0000 | INHALATION_SPRAY | Freq: Four times a day (QID) | RESPIRATORY_TRACT | 0 refills | Status: AC | PRN

## 2019-11-07 MED ORDER — METHYLPREDNISOLONE 4 MG PO TBPK: ORAL_TABLET | ORAL | 0 refills | Status: AC

## 2019-11-07 MED ORDER — DEXTROMETHORPHAN-GUAIFENESIN 10-100 MG/5ML PO LIQD: 10.0000 mL | ORAL | 0 refills | Status: AC | PRN

## 2019-11-07 MED ORDER — DEXAMETHASONE SODIUM PHOSPHATE 10 MG/ML IJ SOLN
4.0000 mg | INTRAMUSCULAR | Status: DC
  Administered 2019-11-07: 4 mg via INTRAVENOUS
  Filled 2019-11-07: qty 1

## 2019-11-07 NOTE — Discharge Summary (Signed)
Donald Trujillo FSF:[id number] DOB: [birth year] DOA: 11/03/2019  PCP: Patient, No Pcp Per  Admit date: 11/03/2019  Discharge date: 11/07/2019  Admitted From: Home   Disposition:  Home   Recommendations for Outpatient Follow-up:   Follow up with PCP in 1-2 weeks  PCP Please obtain BMP/CBC, 2 view CXR in 1week,  (see Discharge instructions)   PCP Please follow up on the following pending results: CMP in 2 weeks   Home Health: None   Equipment/Devices: None  Consultations: None  Discharge Condition: Stable    CODE STATUS: Full    Diet Recommendation: Heart Healthy      Chief Complaint  Patient presents with  . Shortness of Breath     Brief history of present illness from the day of admission and additional interim summary    53 y.o.malewithnomedical historytested positive for Covid at [facility] in [place] last week. Was seen in the emergency department on Christmas Day and was started on azithromycin.  Presented due to worsening shortness of breath.  He was noted to be hypoxic.  He was hospitalized for further management.                                                                  Hospital Course   Acute Hypoxic Resp. Failure/Pneumonia due to COVID-19  He had moderate disease and was treated with steroids + Remdesivir, he now symptom free on RA except for a mild cough, will be sent home on steroid taper + cough medicine and a rescue inhaler.    Recent Labs  Lab 11/03/19 2202 11/04/19 0332 11/04/19 0500 11/05/19 0110 11/05/19 0255 11/06/19 0421 11/07/19 0500 11/07/19 0507  CRP 28.8*  --  25.9* 19.3*  --  9.5*  --  4.6*  DDIMER 1.55*  --  1.51*  --  0.88* 0.87* 0.98*  --   FERRITIN 630*  --   --   --   --   --   --   --   PROCALCITON 0.57  --   --   --   --   --   --   --   SARSCOV2NAA   --  POSITIVE*  --   --   --   --   --   --     Hepatic Function Latest Ref Rng & Units 11/07/2019 11/06/2019 11/05/2019  Total Protein 6.5 - 8.1 g/dL 5.9(L) 6.2(L) 7.1  Albumin 3.5 - 5.0 g/dL 2.3(L) 2.3(L) 2.2(L)  AST 15 - 41 U/L 81(H) 71(H) 109(H)  ALT 0 - 44 U/L 133(H) 114(H) 112(H)  Alk Phosphatase 38 - 126 U/L 206(H) 236(H) 267(H)  Total Bilirubin 0.3 - 1.2 mg/dL 0.7 1.0 0.8     Transaminitis - Likely secondary to COVID-19. Symptom free with Negative Hep panel and improving trend,  PCP to recheck in 2 weeks.    Discharge diagnosis     Principal Problem:   Pneumonia due to COVID-19 virus Active Problems:   Acute respiratory failure due to COVID-19 [facility])    Discharge instructions    Discharge Instructions    Diet - low sodium heart healthy   Complete by: As directed    Discharge instructions   Complete by: As directed    Follow with Primary MD in 7 days   Get CBC, CMP, 2 view Chest X ray -  checked next visit within 1 week by Primary MD   Activity: As tolerated with Full fall precautions use walker/cane & assistance as needed  Disposition Home   Diet: Heart Healthy   Special Instructions: If you have smoked or chewed Tobacco  in the last 2 yrs please stop smoking, stop any regular Alcohol  and or any Recreational drug use.  On your next visit with your primary care physician please Get Medicines reviewed and adjusted.  Please request your Prim.MD to go over all Hospital Tests and Procedure/Radiological results at the follow up, please get all Hospital records sent to your Prim MD by signing hospital release before you go home.  If you experience worsening of your admission symptoms, develop shortness of breath, life threatening emergency, suicidal or homicidal thoughts you must seek medical attention immediately by calling 911 or calling your MD immediately  if symptoms less severe.  You Must read complete instructions/literature along with all the possible  adverse reactions/side effects for all the Medicines you take and that have been prescribed to you. Take any new Medicines after you have completely understood and accpet all the possible adverse reactions/side effects.   Increase activity slowly   Complete by: As directed    MyChart COVID-19 home monitoring program   Complete by: Nov 07, 2019    Is the patient willing to use the [place] for home monitoring?: Yes   Temperature monitoring   Complete by: Nov 07, 2019    After how many days would you like to receive a notification of this patient's flowsheet entries?: 1      Discharge Medications   Allergies as of 11/07/2019   No Known Allergies     Medication List    STOP taking these medications   azithromycin 250 MG tablet Commonly known as: Zithromax Z-Pak   Cough & Chest Congestion DM 5-100 MG/5ML Liqd Generic drug: Dextromethorphan-guaiFENesin Replaced by: dextromethorphan-guaiFENesin 10-100 MG/5ML liquid     TAKE these medications   albuterol 108 (90 Base) MCG/ACT inhaler Commonly known as: VENTOLIN HFA Inhale 2 puffs into the lungs every 6 (six) hours as needed for wheezing or shortness of breath.   dextromethorphan-guaiFENesin 10-100 MG/5ML liquid Commonly known as: Tussin DM Take 10 mLs by mouth every 4 (four) hours as needed for cough. Replaces: Cough & Chest Congestion DM 5-100 MG/5ML Liqd   methylPREDNISolone 4 MG Tbpk tablet Commonly known as: MEDROL DOSEPAK follow package directions       Follow-up Information    PCP. Schedule an appointment as soon as possible for a visit in 1 week(s).           Major procedures and Radiology Reports - PLEASE review detailed and final reports thoroughly  -       DG Chest Port 1 View  Result Date: 11/03/2019 CLINICAL DATA:  Shortness of breath, COVID positive EXAM: PORTABLE CHEST 1 VIEW COMPARISON:  10/31/2019 FINDINGS: Low lung volumes. Interval worsening  of bilateral airspace disease. Normal heart  size. No pneumothorax. IMPRESSION: Interval worsening of bilateral multifocal pneumonia. Electronically Signed   By: Donavan Foil M.D.   On: 11/03/2019 22:35   DG Chest Port 1 View  Result Date: 10/31/2019 CLINICAL DATA:  54 year old male with shortness of breath. EXAM: PORTABLE CHEST 1 VIEW COMPARISON:  None. FINDINGS: There is shallow inspiration. Pain bilateral predominantly peripheral and subpleural hazy densities most consistent with multifocal pneumonia, likely atypical or viral in etiology. Clinical correlation is recommended. There is no pleural effusion or pneumothorax. The cardiac silhouette is within normal limits. No acute osseous pathology. IMPRESSION: Multifocal pneumonia. Clinical correlation is recommended. Follow-up recommended. Electronically Signed   By: Anner Crete M.D.   On: 10/31/2019 20:40    Micro Results     Recent Results (from the past 240 hour(s))  Blood Culture (routine x 2)     Status: None (Preliminary result)   Collection Time: 11/03/19 10:02 PM   Specimen: BLOOD  Result Value Ref Range Status   Specimen Description   Final    BLOOD RIGHT ANTECUBITAL Performed at [facility] [address], [place], [place] [address]    Special Requests   Final    BOTTLES DRAWN AEROBIC AND ANAEROBIC Blood Culture results may not be optimal due to an excessive volume of blood received in culture bottles Performed at [facility] [address] [place], [place] [address]    Culture   Final    NO GROWTH 2 DAYS Performed at [facility] [address], [place], [place] [address]    Report Status PENDING  Incomplete  Blood Culture (routine x 2)     Status: None (Preliminary result)   Collection Time: 11/03/19 11:18 PM   Specimen: BLOOD  Result Value Ref Range Status   Specimen Description   Final    BLOOD LEFT ANTECUBITAL Performed at [facility] [address], [place], [place] [address]     Special Requests   Final    BOTTLES DRAWN AEROBIC AND ANAEROBIC Blood Culture adequate volume Performed at [facility] [address], [place], [place] [address]    Culture   Final    NO GROWTH 2 DAYS Performed at [facility] [address] [place], [place] [address]    Report Status PENDING  Incomplete  Respiratory Panel by RT PCR (Flu A&B, Covid) - Nasopharyngeal Swab     Status: Abnormal   Collection Time: 11/04/19  3:32 AM   Specimen: Nasopharyngeal Swab  Result Value Ref Range Status   SARS Coronavirus 2 by RT PCR POSITIVE (A) NEGATIVE Final    Comment: RESULT CALLED TO, READ BACK BY AND VERIFIED WITH: RN CELINE AT 0503 11/04/19 CRUICKSHANK A (NOTE) SARS-CoV-2 target nucleic acids are DETECTED. SARS-CoV-2 RNA is generally detectable in upper respiratory specimens  during the acute phase of infection. Positive results are indicative of the presence of the identified virus, but do not rule out bacterial infection or co-infection with other pathogens not detected by the test. Clinical correlation with patient history and other diagnostic information is necessary to determine patient infection status. The expected result is Negative. Fact Sheet for Patients:  PinkCheek.be Fact Sheet for Healthcare Providers: GravelBags.it This test is not yet approved or cleared by the [place] FDA and  has been authorized for detection and/or diagnosis of SARS-CoV-2 by FDA under an Emergency Use Authorization (EUA).  This EUA will remain in  effect (meaning this test can be [place] ed) for the duration of  the COVID-19 declaration under Section 564(b)(1) of the Act, 21 U.S.C. section 360bbb-3(b)(1), unless the authorization is terminated or revoked sooner.    Influenza A by PCR NEGATIVE NEGATIVE Final   Influenza B by PCR NEGATIVE NEGATIVE Final    Comment: (NOTE) The Xpert Xpress SARS-CoV-2/FLU/RSV assay  is intended as an aid in  the diagnosis of influenza from Nasopharyngeal swab specimens and  should not be used as a sole basis for treatment. Nasal washings and  aspirates are unacceptable for Xpert Xpress SARS-CoV-2/FLU/RSV  testing. Fact Sheet for Patients: PinkCheek.be Fact Sheet for Healthcare Providers: GravelBags.it This test is not yet approved or cleared by the [place] FDA and  has been authorized for detection and/or diagnosis of SARS-CoV-2 by  FDA under an Emergency Use Authorization (EUA). This EUA will remain  in effect (meaning this test can be used) for the duration of the  Covid-19 declaration under Section 564(b)(1) of the Act, 21  U.S.C. section 360bbb-3(b)(1), unless the authorization is  terminated or revoked. Performed at [facility], [place] [address], [place], [place] [address]     Today   Subjective    Shellee Milo Chalmer Zheng today has no headache,no chest abdominal pain,no new weakness tingling or numbness, feels much better wants to go home today.     Objective   Blood pressure 111/79, pulse 77, temperature 98.7 F (37.1 C), temperature source Oral, resp. rate 16, height _0  (1.651 m), weight 77.6 kg, SpO2 99 %.   Intake/Output Summary (Last 24 hours) at 11/07/2019 0937 Last data filed at 11/07/2019 0500 Gross per 24 hour  Intake 210 ml  Output 310 ml  Net -100 ml    Exam Awake Alert, No new F.N deficits, Normal affect [place].AT,PERRAL Supple Neck,No JVD, No cervical lymphadenopathy appriciated.  Symmetrical Chest wall movement, Good air movement bilaterally, CTAB RRR,No Gallops,Rubs or new Murmurs, No Parasternal Heave +ve B.Sounds, Abd Soft, Non tender, No organomegaly appriciated, No rebound -guarding or rigidity. No Cyanosis, Clubbing or edema, No new Rash or bruise   Data Review   CBC w Diff:  Lab Results  Component Value Date   WBC 16.4 (H) 11/07/2019   HGB  13.0 11/07/2019   HCT 39.0 11/07/2019   PLT 624 (H) 11/07/2019   LYMPHOPCT 17 11/07/2019   MONOPCT 6 11/07/2019   EOSPCT 0 11/07/2019   BASOPCT 0 11/07/2019    CMP:  Lab Results  Component Value Date   NA 138 11/07/2019   K 4.1 11/07/2019   CL 103 11/07/2019   CO2 24 11/07/2019   BUN 14 11/07/2019   CREATININE 0.52 (L) 11/07/2019   PROT 5.9 (L) 11/07/2019   ALBUMIN 2.3 (L) 11/07/2019   BILITOT 0.7 11/07/2019   ALKPHOS 206 (H) 11/07/2019   AST 81 (H) 11/07/2019   ALT 133 (H) 11/07/2019  .   Total Time in preparing paper work, data evaluation and todays exam - 63 minutes  Lala Lund M.D on 11/07/2019 at 9:37 AM  [facility]   Office  [phone]

## 2019-11-07 NOTE — Progress Notes (Signed)
Patient discharged to home with family care. VSS, RA, no complaints of pain or discomfort. PIVs discontinued without complications. Received discharged instructions and verbalized understanding, had no further questions. Left the unit on wheelchair accompanied by staff. Transported home with family via private vehicle. All personal belongings taken with patient. 

## 2019-11-07 NOTE — Discharge Instructions (Signed)
Follow with Primary MD in 7 days   Get CBC, CMP, 2 view Chest X ray -  checked next visit within 1 week by Primary MD   Activity: As tolerated with Full fall precautions use walker/cane & assistance as needed  Disposition Home   Diet: Heart Healthy   Special Instructions: If you have smoked or chewed Tobacco  in the last 2 yrs please stop smoking, stop any regular Alcohol  and or any Recreational drug use.  On your next visit with your primary care physician please Get Medicines reviewed and adjusted.  Please request your Prim.MD to go over all Hospital Tests and Procedure/Radiological results at the follow up, please get all Hospital records sent to your Prim MD by signing hospital release before you go home.  If you experience worsening of your admission symptoms, develop shortness of breath, life threatening emergency, suicidal or homicidal thoughts you must seek medical attention immediately by calling 911 or calling your MD immediately  if symptoms less severe.  You Must read complete instructions/literature along with all the possible adverse reactions/side effects for all the Medicines you take and that have been prescribed to you. Take any new Medicines after you have completely understood and accpet all the possible adverse reactions/side effects.                                                                                [facility]                            [address]                            [place], [place] [address]      Donald Trujillo was admitted to the Hospital on 11/03/2019 and Discharged  11/07/2019 and should be excused from work/school   for 21  days starting from date -  11/03/2019 , may return to work/school without any restrictions.  Call Susa Raring MD, [facility]  [phone] with questions.  Susa Raring M.D on 11/07/2019,at 9:34 AM  [facility]   Office  [phone]     Person Under Monitoring  Name: Donald Trujillo  Location: [address] [place] [address]   Infection Prevention Recommendations for Individuals Confirmed to have, or Being Evaluated for, 2019 Novel Coronavirus (COVID-19) Infection Who Receive Care at Home  Individuals who are confirmed to have, or are being evaluated for, COVID-19 should follow the prevention steps below until a healthcare provider or local or state health department says they can return to normal activities.  Stay home except to get medical care You should restrict activities outside your home, except for getting medical care. Do not go to work, school, or public areas, and do not use public transportation or taxis.  Call ahead before visiting your doctor Before your medical appointment, call the healthcare provider and tell them that you have, or are being evaluated for, COVID-19 infection. This will help the healthcare provider's office take steps to keep other people from getting infected. Ask your healthcare provider to call  the local or state health department.  Monitor your symptoms Seek prompt medical attention if your illness is worsening (e.g., difficulty breathing). Before going to your medical appointment, call the healthcare provider and tell them that you have, or are being evaluated for, COVID-19 infection. Ask your healthcare provider to call the local or state health department.  Wear a facemask You should wear a facemask that covers your nose and mouth when you are in the same room with other people and when you visit a healthcare provider. People who live with or visit you should also wear a facemask while they are in the same room with you.  Separate yourself from other people in your home As much as possible, you should stay in a different room from other people in your home. Also, you should use a separate bathroom, if available.  Avoid sharing household items You should not share dishes, drinking glasses,  cups, eating utensils, towels, bedding, or other items with other people in your home. After using these items, you should wash them thoroughly with soap and water.  Cover your coughs and sneezes Cover your mouth and nose with a tissue when you cough or sneeze, or you can cough or sneeze into your sleeve. Throw used tissues in a lined trash can, and immediately wash your hands with soap and water for at least 20 seconds or use an alcohol-based hand rub.  Wash your [employer] your hands often and thoroughly with soap and water for at least 20 seconds. You can use an alcohol-based hand sanitizer if soap and water are not available and if your hands are not visibly dirty. Avoid touching your eyes, nose, and mouth with unwashed hands.   Prevention Steps for Caregivers and Household Members of Individuals Confirmed to have, or Being Evaluated for, COVID-19 Infection Being Cared for in the Home  If you live with, or provide care at home for, a person confirmed to have, or being evaluated for, COVID-19 infection please follow these guidelines to prevent infection:  Follow healthcare provider's instructions Make sure that you understand and can help the patient follow any healthcare provider instructions for all care.  Provide for the patient's basic needs You should help the patient with basic needs in the home and provide support for getting groceries, prescriptions, and other personal needs.  Monitor the patient's symptoms If they are getting sicker, call his or her medical provider and tell them that the patient has, or is being evaluated for, COVID-19 infection. This will help the healthcare provider's office take steps to keep other people from getting infected. Ask the healthcare provider to call the local or state health department.  Limit the number of people who have contact with the patient  If possible, have only one caregiver for the patient.  Other household members should  stay in another home or place of residence. If this is not possible, they should stay  in another room, or be separated from the patient as much as possible. Use a separate bathroom, if available.  Restrict visitors who do not have an essential need to be in the home.  Keep older adults, very young children, and other sick people away from the patient Keep older adults, very young children, and those who have compromised immune systems or chronic health conditions away from the patient. This includes people with chronic heart, lung, or kidney conditions, diabetes, and cancer.  Ensure good ventilation Make sure that shared spaces in the home have good air  flow, such as from an air conditioner or an opened window, weather permitting.  Wash your hands often  Wash your hands often and thoroughly with soap and water for at least 20 seconds. You can use an alcohol based hand sanitizer if soap and water are not available and if your hands are not visibly dirty.  Avoid touching your eyes, nose, and mouth with unwashed hands.  Use disposable paper towels to dry your hands. If not available, use dedicated cloth towels and replace them when they become wet.  Wear a facemask and gloves  Wear a disposable facemask at all times in the room and gloves when you touch or have contact with the patient's blood, body fluids, and/or secretions or excretions, such as sweat, saliva, sputum, nasal mucus, vomit, urine, or feces.  Ensure the mask fits over your nose and mouth tightly, and do not touch it during use.  Throw out disposable facemasks and gloves after using them. Do not reuse.  Wash your hands immediately after removing your facemask and gloves.  If your personal clothing becomes contaminated, carefully remove clothing and launder. Wash your hands after handling contaminated clothing.  Place all used disposable facemasks, gloves, and other waste in a lined container before disposing them with other  household waste.  Remove gloves and wash your hands immediately after handling these items.  Do not share dishes, glasses, or other household items with the patient  Avoid sharing household items. You should not share dishes, drinking glasses, cups, eating utensils, towels, bedding, or other items with a patient who is confirmed to have, or being evaluated for, COVID-19 infection.  After the person uses these items, you should wash them thoroughly with soap and water.  Wash laundry thoroughly  Immediately remove and wash clothes or bedding that have blood, body fluids, and/or secretions or excretions, such as sweat, saliva, sputum, nasal mucus, vomit, urine, or feces, on them.  Wear gloves when handling laundry from the patient.  Read and follow directions on labels of laundry or clothing items and detergent. In general, wash and dry with the warmest temperatures recommended on the label.  Clean all areas the individual has used often  Clean all touchable surfaces, such as counters, tabletops, doorknobs, bathroom fixtures, toilets, phones, keyboards, tablets, and bedside tables, every day. Also, clean any surfaces that may have blood, body fluids, and/or secretions or excretions on them.  Wear gloves when cleaning surfaces the patient has come in contact with.  Use a diluted bleach solution (e.g., dilute bleach with 1 part bleach and 10 parts water) or a household disinfectant with a label that says EPA-registered for coronaviruses. To make a bleach solution at home, add 1 tablespoon of bleach to 1 quart (4 cups) of water. For a larger supply, add  cup of bleach to 1 gallon (16 cups) of water.  Read labels of cleaning products and follow recommendations provided on product labels. Labels contain instructions for safe and effective use of the cleaning product including precautions you should take when applying the product, such as wearing gloves or eye protection and making sure you have  good ventilation during use of the product.  Remove gloves and wash hands immediately after cleaning.  Monitor yourself for signs and symptoms of illness Caregivers and household members are considered close contacts, should monitor their health, and will be asked to limit movement outside of the home to the extent possible. Follow the monitoring steps for close contacts listed on the symptom monitoring form.   ?  If you have additional questions, contact your local health department or call the epidemiologist on call at [phone] (available 24/7). ? This guidance is subject to change. For the most up-to-date guidance from [facility], please refer to their website: TripMetro.hu

## 2019-11-07 NOTE — Progress Notes (Signed)
Ambulated patient in [place] on room air, he maintained sats between 93-95%, HR got slightly elevated to upper 90's and he felt chest discomfort as he coughed, but overall it was a good activity and he claimed to fell alright.

## 2019-11-09 LAB — CULTURE, BLOOD (ROUTINE X 2)
Culture: NO GROWTH
Culture: NO GROWTH
Special Requests: ADEQUATE

## 2019-12-09 ENCOUNTER — Other Ambulatory Visit: Payer: Self-pay

## 2020-10-15 IMAGING — DX DG CHEST 1V PORT
1 series · 1 of 1 positions shown · non-contrast
Comparison: 10/31/2019

CLINICAL DATA: Shortness of breath, COVID positive

EXAM:
PORTABLE CHEST 1 VIEW

[chest ap]
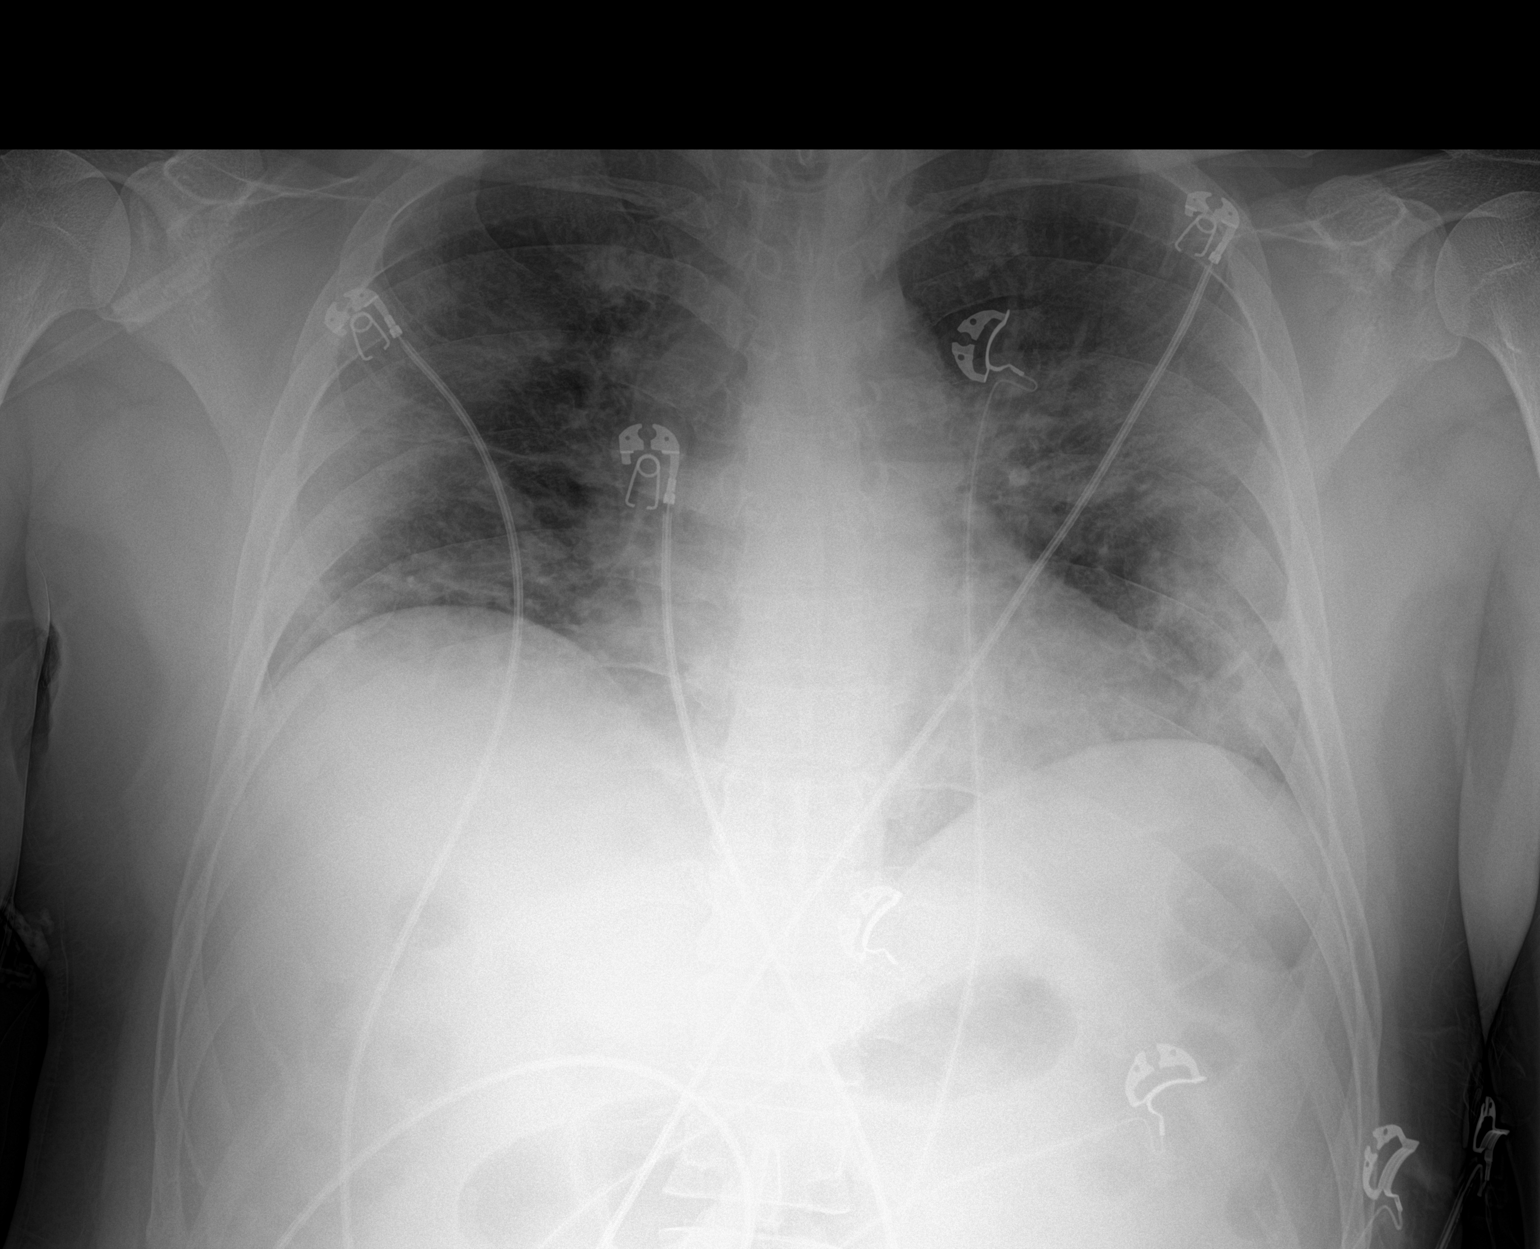

[1 of 1 positions shown; findings below may reference images not displayed]

FINDINGS: Low lung volumes. Interval worsening of bilateral airspace disease.
Normal heart size. No pneumothorax.
IMPRESSION: Interval worsening of bilateral multifocal pneumonia.
